# Patient Record
Sex: Female | Born: 1995
Health system: Southern US, Community
[De-identification: ages and names within clinical notes are randomized; demographics above are authoritative.]

## PROBLEM LIST (undated history)

## (undated) DIAGNOSIS — F419 Anxiety disorder, unspecified: Secondary | ICD-10-CM

## (undated) DIAGNOSIS — D649 Anemia, unspecified: Secondary | ICD-10-CM

## (undated) DIAGNOSIS — E119 Type 2 diabetes mellitus without complications: Secondary | ICD-10-CM

## (undated) DIAGNOSIS — F429 Obsessive-compulsive disorder, unspecified: Secondary | ICD-10-CM

## (undated) DIAGNOSIS — F329 Major depressive disorder, single episode, unspecified: Secondary | ICD-10-CM

## (undated) DIAGNOSIS — Z973 Presence of spectacles and contact lenses: Secondary | ICD-10-CM

## (undated) DIAGNOSIS — H40009 Preglaucoma, unspecified, unspecified eye: Secondary | ICD-10-CM

## (undated) DIAGNOSIS — E162 Hypoglycemia, unspecified: Secondary | ICD-10-CM

## (undated) DIAGNOSIS — N939 Abnormal uterine and vaginal bleeding, unspecified: Secondary | ICD-10-CM

## (undated) DIAGNOSIS — F32A Depression, unspecified: Secondary | ICD-10-CM

## (undated) HISTORY — DX: Obsessive-compulsive disorder, unspecified: F42.9

## (undated) HISTORY — DX: Preglaucoma, unspecified, unspecified eye: H40.009

## (undated) HISTORY — DX: Hypoglycemia, unspecified: E16.2

## (undated) HISTORY — DX: Anemia, unspecified: D64.9

## (undated) HISTORY — DX: Major depressive disorder, single episode, unspecified: F32.9

## (undated) HISTORY — DX: Depression, unspecified: F32.A

## (undated) HISTORY — PX: WISDOM TOOTH EXTRACTION: SHX21

## (undated) HISTORY — DX: Anxiety disorder, unspecified: F41.9

## (undated) HISTORY — PX: DENTAL SURGERY: SHX609

---
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
## 2014-02-11 ENCOUNTER — Ambulatory Visit: Admitting: Obstetrics & Gynecology

## 2014-02-13 ENCOUNTER — Ambulatory Visit: Admitting: Obstetrics & Gynecology

## 2014-03-04 ENCOUNTER — Ambulatory Visit: Admitting: Obstetrics & Gynecology

## 2014-03-13 ENCOUNTER — Encounter: Payer: Self-pay | Admitting: Obstetrics & Gynecology

## 2014-03-13 ENCOUNTER — Ambulatory Visit (INDEPENDENT_AMBULATORY_CARE_PROVIDER_SITE_OTHER): Admitting: Obstetrics & Gynecology

## 2014-03-13 VITALS — BP 108/66 | HR 97 | Temp 97.6°F | Ht 64.0 in | Wt 112.0 lb

## 2014-03-13 DIAGNOSIS — N926 Irregular menstruation, unspecified: Secondary | ICD-10-CM

## 2014-03-13 DIAGNOSIS — N939 Abnormal uterine and vaginal bleeding, unspecified: Secondary | ICD-10-CM

## 2014-03-13 MED ORDER — MEFENAMIC ACID 250 MG PO CAPS: 500.0000 mg | ORAL_CAPSULE | Freq: Three times a day (TID) | ORAL | Status: DC

## 2014-03-14 ENCOUNTER — Ambulatory Visit: Admitting: Obstetrics & Gynecology

## 2014-03-14 NOTE — Progress Notes (Signed)
Patient ID: Jillian [name], female   DOB: [birth year], 18 y.o.   MRN: [id number]  Chief Complaint  Patient presents with  . Follow-up    HPI Jillian [name] is a 18 y.o. female.  HPI  Past Medical History  Diagnosis Date  . Hypoglycemia   . Anemia   . Anxiety   . Depression   . OCD (obsessive compulsive disorder)     Past Surgical History  Procedure Laterality Date  . Dental surgery    . Wisdom tooth extraction Bilateral     Family History  Problem Relation Age of Onset  . Cancer      unknown  . Diabetes Mother   . Diabetes Paternal Grandmother   . Hypertension Paternal Grandfather   . Heart disease Maternal Grandfather   . Healthy Sister     x 3 half  . Glaucoma Father   . Eczema Sister     Social History History  Substance Use Topics  . Smoking status: Never Smoker   . Smokeless tobacco: Never Used  . Alcohol Use: No    No Known Allergies  Current Outpatient Prescriptions  Medication Sig Dispense Refill  . Mefenamic Acid (PONSTEL) 250 MG CAPS Take 2 capsules (500 mg total) by mouth 3 (three) times daily.  28 each  2  . senna (SENOKOT) 8.6 MG tablet Take 1 tablet by mouth daily.      . sertraline (ZOLOFT) 100 MG tablet Take 200 mg by mouth daily.       . traZODone (DESYREL) 50 MG tablet Take 150 mg by mouth at bedtime.        No current facility-administered medications for this visit.    Review of Systems Review of Systems Constitutional: negative for fatigue and weight loss Respiratory: negative for cough and wheezing Cardiovascular: negative for chest pain, fatigue and palpitations Gastrointestinal: negative for abdominal pain and change in bowel habits Genitourinary:negative for abnormal discharge Integument/breast: negative for nipple discharge Musculoskeletal:negative for myalgias Neurological: negative for gait problems and tremors Behavioral/Psych: negative for abusive relationship, depression Endocrine: negative for temperature intolerance      Blood pressure 108/66, pulse 97, temperature 97.6 F (36.4 C), height 5\' 4"  (1.626 m), weight 50.803 kg (112 lb), last menstrual period 03/04/2014.  Physical Exam Physical Exam   50% of 15 min visit spent on counseling and coordination of care.   Data Reviewed None  Assessment   Unscheduled bleeding with good response to Ponstel    Plan     Meds ordered this encounter  Medications  . DISCONTD: Mefenamic Acid (PONSTEL) 250 MG CAPS    Sig: Take 500 mg by mouth 3 (three) times daily.  . Mefenamic Acid (PONSTEL) 250 MG CAPS    Sig: Take 2 capsules (500 mg total) by mouth 3 (three) times daily.    Dispense:  28 each    Refill:  2    Follow up as needed.        JACKSON-MOORE,LISA A 03/14/2014, 1:07 PM

## 2014-07-18 ENCOUNTER — Other Ambulatory Visit: Payer: Self-pay | Admitting: Obstetrics & Gynecology

## 2014-07-18 NOTE — Telephone Encounter (Signed)
Please advise 

## 2014-07-21 NOTE — Telephone Encounter (Signed)
OK to refill

## 2014-07-31 ENCOUNTER — Inpatient Hospital Stay (HOSPITAL_COMMUNITY)
Admission: AD | Admit: 2014-07-31 | Discharge: 2014-08-05 | DRG: 885 | Disposition: A | Discharge status: Home / Self Care | Payer: Federal, State, Local not specified - Other | Attending: Psychiatry | Admitting: Psychiatry

## 2014-07-31 ENCOUNTER — Encounter (HOSPITAL_COMMUNITY): Payer: Self-pay | Admitting: General Practice

## 2014-07-31 DIAGNOSIS — F332 Major depressive disorder, recurrent severe without psychotic features: Secondary | ICD-10-CM | POA: Diagnosis present

## 2014-07-31 DIAGNOSIS — F42 Obsessive-compulsive disorder: Secondary | ICD-10-CM | POA: Diagnosis present

## 2014-07-31 DIAGNOSIS — Z8249 Family history of ischemic heart disease and other diseases of the circulatory system: Secondary | ICD-10-CM

## 2014-07-31 DIAGNOSIS — F411 Generalized anxiety disorder: Secondary | ICD-10-CM | POA: Diagnosis present

## 2014-07-31 DIAGNOSIS — R45851 Suicidal ideations: Secondary | ICD-10-CM | POA: Diagnosis present

## 2014-07-31 DIAGNOSIS — F331 Major depressive disorder, recurrent, moderate: Secondary | ICD-10-CM | POA: Insufficient documentation

## 2014-07-31 DIAGNOSIS — F410 Panic disorder [episodic paroxysmal anxiety] without agoraphobia: Secondary | ICD-10-CM | POA: Diagnosis present

## 2014-07-31 DIAGNOSIS — F329 Major depressive disorder, single episode, unspecified: Secondary | ICD-10-CM | POA: Diagnosis present

## 2014-07-31 DIAGNOSIS — Z833 Family history of diabetes mellitus: Secondary | ICD-10-CM | POA: Diagnosis not present

## 2014-07-31 DIAGNOSIS — G4700 Insomnia, unspecified: Secondary | ICD-10-CM | POA: Diagnosis present

## 2014-07-31 DIAGNOSIS — Z91013 Allergy to seafood: Secondary | ICD-10-CM | POA: Diagnosis not present

## 2014-07-31 LAB — GLUCOSE, CAPILLARY: Glucose-Capillary: 77 mg/dL (ref 70–99)

## 2014-07-31 LAB — URINALYSIS, ROUTINE W REFLEX MICROSCOPIC
Bilirubin Urine: NEGATIVE
Glucose, UA: NEGATIVE mg/dL
Ketones, ur: NEGATIVE mg/dL
Leukocytes, UA: NEGATIVE
Nitrite: NEGATIVE
PROTEIN: NEGATIVE mg/dL
SPECIFIC GRAVITY, URINE: 1.027 (ref 1.005–1.030)
UROBILINOGEN UA: 0.2 mg/dL (ref 0.0–1.0)
pH: 6.5 (ref 5.0–8.0)

## 2014-07-31 LAB — COMPREHENSIVE METABOLIC PANEL
ALK PHOS: 96 U/L (ref 39–117)
ALT: 12 U/L (ref 0–35)
AST: 17 U/L (ref 0–37)
Albumin: 4.1 g/dL (ref 3.5–5.2)
Anion gap: 11 (ref 5–15)
BILIRUBIN TOTAL: 0.2 mg/dL — AB (ref 0.3–1.2)
BUN: 13 mg/dL (ref 6–23)
CALCIUM: 9.8 mg/dL (ref 8.4–10.5)
CHLORIDE: 101 meq/L (ref 96–112)
CO2: 29 meq/L (ref 19–32)
Creatinine, Ser: 0.83 mg/dL (ref 0.50–1.10)
GFR calc Af Amer: >90 mL/min (ref >90)
GFR calc non Af Amer: >90 mL/min (ref >90)
Glucose, Bld: 85 mg/dL (ref 70–99)
Potassium: 3.5 mEq/L — ABNORMAL LOW (ref 3.7–5.3)
SODIUM: 141 meq/L (ref 137–147)
Total Protein: 8 g/dL (ref 6.0–8.3)

## 2014-07-31 LAB — CBC
HCT: 37.1 % (ref 36.0–46.0)
Hemoglobin: 12 g/dL (ref 12.0–15.0)
MCH: 28 pg (ref 26.0–34.0)
MCHC: 32.3 g/dL (ref 30.0–36.0)
MCV: 86.7 fL (ref 78.0–100.0)
PLATELETS: 331 10*3/uL (ref 150–400)
RBC: 4.28 MIL/uL (ref 3.87–5.11)
RDW: 12.8 % (ref 11.5–15.5)
WBC: 10.6 10*3/uL — AB (ref 4.0–10.5)

## 2014-07-31 LAB — RAPID URINE DRUG SCREEN, HOSP PERFORMED
Amphetamines: NOT DETECTED
Barbiturates: NOT DETECTED
Benzodiazepines: NOT DETECTED
Cocaine: NOT DETECTED
Opiates: NOT DETECTED
TETRAHYDROCANNABINOL: NOT DETECTED

## 2014-07-31 LAB — URINE MICROSCOPIC-ADD ON

## 2014-07-31 LAB — PREGNANCY, URINE: PREG TEST UR: NEGATIVE

## 2014-07-31 LAB — ETHANOL: Alcohol, Ethyl (B): <11 mg/dL (ref 0–11)

## 2014-07-31 MED ORDER — TRAZODONE HCL 50 MG PO TABS: 50.0000 mg | ORAL_TABLET | Freq: Every evening | ORAL | Status: DC | PRN

## 2014-07-31 MED ORDER — ALUM & MAG HYDROXIDE-SIMETH 200-200-20 MG/5ML PO SUSP
30.0000 mL | ORAL | Status: DC | PRN
  Filled 2014-07-31: qty 30

## 2014-07-31 MED ORDER — MAGNESIUM HYDROXIDE 400 MG/5ML PO SUSP
30.0000 mL | Freq: Every day | ORAL | Status: DC | PRN
  Filled 2014-07-31: qty 30

## 2014-07-31 MED ORDER — ACETAMINOPHEN 325 MG PO TABS
650.0000 mg | ORAL_TABLET | Freq: Four times a day (QID) | ORAL | Status: DC | PRN
  Filled 2014-07-31: qty 2

## 2014-07-31 MED ORDER — HYDROXYZINE HCL 25 MG PO TABS
25.0000 mg | ORAL_TABLET | Freq: Four times a day (QID) | ORAL | Status: DC | PRN
  Administered 2014-07-31: 25 mg via ORAL
  Filled 2014-07-31: qty 30
  Filled 2014-07-31 (×2): qty 1

## 2014-07-31 MED ORDER — TRAZODONE HCL 100 MG PO TABS
100.0000 mg | ORAL_TABLET | Freq: Every evening | ORAL | Status: DC | PRN
  Administered 2014-07-31 – 2014-08-04 (×5): 100 mg via ORAL
  Filled 2014-07-31 (×2): qty 1
  Filled 2014-07-31: qty 14
  Filled 2014-07-31 (×3): qty 1

## 2014-07-31 NOTE — Tx Team (Signed)
Initial Interdisciplinary Treatment Plan   PATIENT STRESSORS: Pt. reports medication is no longer working for her   PROBLEM LIST: Problem List/Patient Goals Date to be addressed Date deferred Reason deferred Estimated date of resolution  Risk for suicide  07/31/2014           Depression 07/31/2014           Anxiety 07/31/2014                              DISCHARGE CRITERIA:  Improved stabilization in mood, thinking, and/or behavior Verbal commitment to aftercare and medication compliance  PRELIMINARY DISCHARGE PLAN: Attend PHP/IOP Outpatient therapy  PATIENT/FAMIILY INVOLVEMENT: This treatment plan has been presented to and reviewed with the patient, Jillian [name].  The patient and family have been given the opportunity to ask questions and make suggestions.  Jillian [name] 07/31/2014, 6:34 PM

## 2014-07-31 NOTE — Plan of Care (Signed)
Problem: Ineffective individual coping Goal: STG: Patient will remain free from self harm Outcome: Progressing Pt safe on the unit Aeb visual and documented assessment  Problem: Diagnosis: Increased Risk For Suicide Attempt Goal: STG-Patient Will Comply With Medication Regime Outcome: Progressing Pt takes medication

## 2014-07-31 NOTE — Progress Notes (Signed)
D: Passive SI-contracts for safety Pt denies SHI/AVH. Pt is pleasant and cooperative. Pt stated she was glad she came to get help. Pt a little cautious, but does talk to Probation officer.  A: Pt was offered support and encouragement. Pt was given scheduled medications. Pt was encourage to attend groups. Q 15 minute checks were done for safety.   R:Pt attends groups and interacts well with peers and staff. Pt is taking medication. Pt has no complaints at this time.Pt receptive to treatment and safety maintained on unit.

## 2014-07-31 NOTE — Progress Notes (Signed)
Adult Psychoeducational Group Note  Date:  07/31/2014 Time:  8:42 PM  Group Topic/Focus:  Wrap-Up Group:   The focus of this group is to help patients review their daily goal of treatment and discuss progress on daily workbooks.  Participation Level:  Active  Participation Quality:  Appropriate  Affect:  Appropriate  Cognitive:  Appropriate  Insight: Appropriate  Engagement in Group:  Engaged  Modes of Intervention:  Discussion  Additional Comments: The patient had just arrived and did not attend group. Nash Shearer 07/31/2014, 8:42 PM

## 2014-07-31 NOTE — Progress Notes (Signed)
Patient ID: Jillian [name], female   DOB: Aug 11, 1996, 18 y.o.   MRN: [id number]  Jillian [name] is a 18 year old patient that walked in to [facility] for suicidal ideation. Patient reports that she has been on Zoloft for at least four years and reports it is no longer working for her. 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 Patient has a past medical history of hypoglycemia, anemia,  Depression, anxiety, and OCD. Patient reports that at times she has intrusive thoughts about hurting others but reports it is not directed towards a certain person. Patient denies A/V hallucinations. Patient reports she can contract for safety while in the hospital. Patient verbalized understanding of the admission process. Patient was oriented to the unit and was given hygiene products and went to dinner. Patient appears anxious but was encouraged by staff to come to them with any needs. Q15 minute safety checks were initiated and maintained at this time.

## 2014-07-31 NOTE — BH Assessment (Addendum)
Assessment Note  Jillian [name] is an 18 y.o. female. Pt presents to [facility] accompanied by her mother. Pt presents with C/O increased depression, anxiety and SI with a plan to overdose on her medication. Pt reports feeling overwhelmed about her academics and reports that she is not doing well. Pt reports that she consulted with her professor today about her concerns regarding dropping a class. Pt informed her professor today that she has been having some rough weekends as evidenced by decreased sleep and poor appetite. Pt reports that she has been compliant with her medication but mother reports that patient has had periods over the past month where she has not taken her psychiatric medications consistently. Pt reports that she has had previous issues with weight loss and not eating and implies that she may have an eating disorder(no binging or purging reporting) but has  never received treatment or been officially diagnosed with one. Pt and mother reports that pt's psychiatrist Marcy Salvo at [facility] are aware and have addressed her potential issues regarding possible eating disorder. Pt reports that she has been on a downward spiral for the past month and her symptoms are getting worse. Pt reports low energy and feeling fatigue.  Pt's mother reports that she is concerned about patient's safety due to remarks of wanting to commit suicide. Pt' s mother reports that she just wants patient to develop and healthy sense of self and feel like she deserves to live. Pt is unable to contract for safety and inpatient treatment is recommended for safety and stabilization.  Consulted with AC Debarah Crape and Elmarie Shiley, NP whom is recommending inpatient psychiatric treatment for safety and stabilization as patient meets inpatient treatment criteria. Pt accepted to bed 502-1 bed 1. Support paperwork completed by TTS.  Axis I: Major Depression, single episode Axis II: Deferred Axis III:  Past Medical History   Diagnosis Date  . Hypoglycemia   . Anemia   . Anxiety   . Depression   . OCD (obsessive compulsive disorder)    Axis IV: educational problems, other psychosocial or environmental problems and problems related to social environment Axis V: 21-30 behavior considerably influenced by delusions or hallucinations OR serious impairment in judgment, communication OR inability to function in almost all areas  Past Medical History:  Past Medical History  Diagnosis Date  . Hypoglycemia   . Anemia   . Anxiety   . Depression   . OCD (obsessive compulsive disorder)     Past Surgical History  Procedure Laterality Date  . Dental surgery    . Wisdom tooth extraction Bilateral     Family History:  Family History  Problem Relation Age of Onset  . Cancer      unknown  . Diabetes Mother   . Diabetes Paternal Grandmother   . Hypertension Paternal Grandfather   . Heart disease Maternal Grandfather   . Healthy Sister     x 3 half  . Glaucoma Father   . Eczema Sister     Social History:  reports that she has never smoked. She has never used smokeless tobacco. She reports that she does not drink alcohol or use illicit drugs.  Additional Social History:  Alcohol / Drug Use History of alcohol / drug use?: No history of alcohol / drug abuse  CIWA: CIWA-Ar BP: 111/65 mmHg Pulse Rate: 94 COWS:    Allergies: No Known Allergies  Home Medications:  Medications Prior to Admission  Medication Sig Dispense Refill  . Mefenamic  Acid 250 MG CAPS TAKE 2 CAPSULES (500 MG TOTAL) THREE TIMES A DAY 28 capsule 1  . senna (SENOKOT) 8.6 MG tablet Take 1 tablet by mouth daily.    . sertraline (ZOLOFT) 100 MG tablet Take 200 mg by mouth daily.     . traZODone (DESYREL) 50 MG tablet Take 150 mg by mouth at bedtime.       OB/GYN Status:  No LMP recorded.  General Assessment Data Location of Assessment: [facility] Is this a Tele or Face-to-Face Assessment?: Face-to-Face Is this an  Initial Assessment or a Re-assessment for this encounter?: Initial Assessment Living Arrangements: Non-relatives/Friends (roommate) Can pt return to current living arrangement?: Yes Admission Status: Voluntary Is patient capable of signing voluntary admission?: Yes Transfer from: Other (Comment) Referral Source: Self/Family/Friend     [place] Living Arrangements: Non-relatives/Friends (roommate) Name of Psychiatrist: Thompson Caul Name of Therapist: Ky Barban   Education Status Is patient currently in school?: Yes Current Grade: Student at [employer] Highest grade of school patient has completed: HS and some college Name of school: [employer] person: NA  Risk to self with the past 6 months Suicidal Ideation: Yes-Currently Present Suicidal Intent: Yes-Currently Present Is patient at risk for suicide?: Yes Suicidal Plan?: Yes-Currently Present Specify Current Suicidal Plan: plan to overdose on rx medication Access to Means: Yes Specify Access to Suicidal Means: access to rx meds What has been your use of drugs/alcohol within the last 12 months?: none reported Previous Attempts/Gestures: No (pt reports prior hx of SI) How many times?: 0 Other Self Harm Risks: none reported Triggers for Past Attempts: Unpredictable Intentional Self Injurious Behavior: None Family Suicide History: Yes (gm was dx Bipolar and Schizophrenic,uncle-suicide) Recent stressful life event(s): Conflict (Comment), Other (Comment) (academic related stress) Persecutory voices/beliefs?: No Depression: Yes Depression Symptoms: Despondent, Insomnia, Isolating, Fatigue, Guilt, Loss of interest in usual pleasures, Feeling worthless/self pity Substance abuse history and/or treatment for substance abuse?: No Suicide prevention information given to non-admitted patients: Not applicable  Risk to Others within the past 6 months Homicidal Ideation: No Thoughts of Harm to Others:  No Current Homicidal Intent: No Current Homicidal Plan: No Access to Homicidal Means: No Identified Victim: na History of harm to others?: No Assessment of Violence: None Noted Violent Behavior Description: None Noted Does patient have access to weapons?: No Criminal Charges Pending?: No Does patient have a court date: No  Psychosis Hallucinations: None noted Delusions: None noted  Mental Status Report Appear/Hygiene: Unremarkable Eye Contact: Good Motor Activity: Freedom of movement Speech: Soft Level of Consciousness: Alert Mood: Anxious Affect: Anxious Anxiety Level: Minimal Thought Processes: Coherent, Relevant Judgement: Unimpaired Orientation: Person, Place, Time, Situation Obsessive Compulsive Thoughts/Behaviors: None  Cognitive Functioning Concentration: Normal Memory: Recent Intact, Remote Intact IQ: Average Insight: Fair Impulse Control: Fair Appetite: Poor Weight Loss:  (ukn) Weight Gain: 0 Sleep: Decreased Total Hours of Sleep: 6 Vegetative Symptoms: None  ADLScreening [facility]) Patient's cognitive ability adequate to safely complete daily activities?: Yes Patient able to express need for assistance with ADLs?: Yes Independently performs ADLs?: Yes (appropriate for developmental age)  Prior Inpatient Therapy Prior Inpatient Therapy: No Prior Therapy Dates: na Prior Therapy Facilty/Provider(s): na Reason for Treatment: na  Prior Outpatient Therapy Prior Outpatient Therapy: Yes Prior Therapy Dates: Current Provider Prior Therapy Facilty/Provider(s): [facility] Reason for Treatment: Psychiatry and OPT  ADL Screening (condition at time of admission) Patient's cognitive ability adequate to safely complete daily activities?: Yes Is the  patient deaf or have difficulty hearing?: No Does the patient have difficulty seeing, even when wearing glasses/contacts?: No Does the patient have difficulty concentrating, remembering,  or making decisions?: No Patient able to express need for assistance with ADLs?: Yes Does the patient have difficulty dressing or bathing?: No Independently performs ADLs?: Yes (appropriate for developmental age) Does the patient have difficulty walking or climbing stairs?: No Weakness of Legs: None Weakness of Arms/Hands: None  Home Assistive Devices/Equipment Home Assistive Devices/Equipment: None  Therapy Consults (therapy consults require a physician order) PT Evaluation Needed: No OT Evalulation Needed: No SLP Evaluation Needed: No Abuse/Neglect Assessment (Assessment to be complete while patient is alone) Physical Abuse: Denies Verbal Abuse: Denies Sexual Abuse: Denies Exploitation of patient/patient's resources: Denies Self-Neglect: Denies Values / Beliefs Cultural Requests During Hospitalization: None Spiritual Requests During Hospitalization: None Consults Spiritual Care Consult Needed: No Social Work Consult Needed: No Regulatory affairs officer (For Healthcare) Does patient have an advance directive?: No Would patient like information on creating an advanced directive?: No - patient declined information Nutrition Screen- [facility] Adult/WL/AP Patient's home diet: Regular  Additional Information 1:1 In Past 12 Months?: No CIRT Risk: No Elopement Risk: No Does patient have medical clearance?: No     Disposition:  Disposition Initial Assessment Completed for this Encounter: Yes Disposition of Patient: Inpatient treatment program (Pt accepted to [facility] per Elvera Bicker to 502-1) Type of inpatient treatment program: Adult  On Site Evaluation by:   Reviewed with Physician:    Wellington Hampshire, MS, LCASA Assessment Counselor  07/31/2014 7:59 PM

## 2014-08-01 DIAGNOSIS — F411 Generalized anxiety disorder: Secondary | ICD-10-CM

## 2014-08-01 DIAGNOSIS — F332 Major depressive disorder, recurrent severe without psychotic features: Secondary | ICD-10-CM | POA: Insufficient documentation

## 2014-08-01 DIAGNOSIS — R45851 Suicidal ideations: Secondary | ICD-10-CM

## 2014-08-01 LAB — GLUCOSE, CAPILLARY
GLUCOSE-CAPILLARY: 73 mg/dL (ref 70–99)
GLUCOSE-CAPILLARY: 78 mg/dL (ref 70–99)

## 2014-08-01 LAB — TSH: TSH: 0.914 u[IU]/mL (ref 0.350–4.500)

## 2014-08-01 MED ORDER — POTASSIUM CHLORIDE CRYS ER 20 MEQ PO TBCR
30.0000 meq | EXTENDED_RELEASE_TABLET | Freq: Once | ORAL | Status: AC
  Administered 2014-08-01: 30 meq via ORAL
  Filled 2014-08-01 (×3): qty 1

## 2014-08-01 MED ORDER — SERTRALINE HCL 100 MG PO TABS
100.0000 mg | ORAL_TABLET | Freq: Two times a day (BID) | ORAL | Status: DC
  Administered 2014-08-01 – 2014-08-05 (×8): 100 mg via ORAL
  Filled 2014-08-01: qty 1
  Filled 2014-08-01: qty 2
  Filled 2014-08-01 (×6): qty 1
  Filled 2014-08-01: qty 28
  Filled 2014-08-01 (×2): qty 1
  Filled 2014-08-01: qty 28
  Filled 2014-08-01: qty 2
  Filled 2014-08-01: qty 1

## 2014-08-01 MED ORDER — ENSURE COMPLETE PO LIQD
237.0000 mL | Freq: Two times a day (BID) | ORAL | Status: DC
  Administered 2014-08-01 – 2014-08-05 (×8): 237 mL via ORAL

## 2014-08-01 MED ORDER — ARIPIPRAZOLE 2 MG PO TABS
2.0000 mg | ORAL_TABLET | Freq: Every day | ORAL | Status: DC
  Administered 2014-08-01 – 2014-08-05 (×5): 2 mg via ORAL
  Filled 2014-08-01 (×3): qty 1
  Filled 2014-08-01: qty 14
  Filled 2014-08-01 (×5): qty 1

## 2014-08-01 NOTE — H&P (Signed)
Psychiatric Admission Assessment Adult  Patient Identification:  Jillian [name] Date of Evaluation:  08/01/2014 Chief Complaint:  MDD History of Present Illness:  Jillian [name] is an 18 y.o. 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  She presents to [facility]  accompanied by her mother. Pt presents with C/O increased depression, anxiety and SI with a plan to overdose on her medication.  "I usually have suicidal thoughts but I give myself 24 hrs to see if I can overcome the feelings.  I always have gotten over it."  Pt reports feeling overwhelmed about her academics and reports that she is not doing well. Pt reports that she consulted with her professor today about her concerns regarding dropping a class, having some insomnia and poor appetite. Pt reports that she has been compliant with her medication but mother reports that patient has had periods over the past month where she has not taken her psychiatric medications consistently. Pt reports that she has had previous issues with weight loss and not eating and implies that she may have an eating disorder(no binging or purging reporting) but has never received treatment or been officially diagnosed with one. Pt and mother reports that pt's psychiatrist Marcy Salvo at [facility] are aware and have addressed her potential issues regarding possible eating disorder. Pt reports that she has been on a downward spiral for the past month and her symptoms are getting worse. Pt reports low energy and feeling fatigue. Pt's mother reports that she is concerned about patient's safety due to remarks of wanting to commit suicide. Pt' s mother reports that she just wants patient to develop and healthy sense of self and feel like she deserves to live. Pt is unable to contract for safety and inpatient treatment is recommended for safety and stabilization.  It is recommended that [place] would benefit from inpatient psychiatric treatment for safety and  stabilization as patient meets inpatient treatment criteria.  Medication management as part of care plan.    Elements:  Location:  Anxiety disorder. Quality:  Anxiety, fear of large crowds, feelings of isolating. Severity:  Severe, came in to [facility], stated she had SI via OD. Timing:  Increased stress at school.  She is a Electronics engineer. Duration:  Chronic, Diagnosed with anxiety disorder at 18 year old. Context:  "I go through this every other month.  I feel anxious and depressed about school and in social settings?. Associated Signs/Synptoms: Depression Symptoms:  depressed mood, anhedonia, insomnia, fatigue, feelings of worthlessness/guilt, difficulty concentrating, hopelessness, recurrent thoughts of death, suicidal attempt, anxiety, panic attacks, (Hypo) Manic Symptoms:  NA Anxiety Symptoms:  NA Psychotic Symptoms:  NA PTSD Symptoms: NA Total Time spent with patient: 45 minutes  Psychiatric Specialty Exam: Physical Exam  Vitals reviewed. Psychiatric: Her speech is normal and behavior is normal. Judgment and thought content normal. Her mood appears anxious. Cognition and memory are normal.    Review of Systems  Constitutional: Negative.   HENT: Negative.   Eyes: Negative.   Respiratory: Negative.   Cardiovascular: Negative.   Gastrointestinal: Negative.   Genitourinary: Negative.   Musculoskeletal: Negative.   Skin: Negative.   Neurological: Negative.   Endo/Heme/Allergies: Negative.   Psychiatric/Behavioral: Positive for depression. Negative for suicidal ideas, hallucinations, memory loss and substance abuse. The patient is nervous/anxious. The patient does not have insomnia.     Blood pressure 101/51, pulse 98, temperature 98.5 F (36.9 C), temperature source Oral, resp. rate 18, height 5' 3.25" (1.607 m), weight  52.164 kg (115 lb).Body mass index is 20.2 kg/(m^2).  General Appearance: Fairly Groomed  Engineer, water::  Fair  Speech:  Normal Rate  Volume:  Normal   Mood:  She is calm and intelligent  Affect:  Appropriate  Thought Process:  Intact, Linear and Logical  Orientation:  Full (Time, Place, and Person)  Thought Content:  Rumination  Suicidal Thoughts:  Yes.  with intent/plan  Homicidal Thoughts:  No  Memory:  Immediate;   Good Recent;   Good Remote;   Good  Judgement:  Good  Insight:  Good  Psychomotor Activity:  Negative and Normal  Concentration:  Good  Recall:  Good  Fund of Knowledge:Good  Language: Good  Akathisia:  Negative  Handed:  Right  AIMS (if indicated):     Assets:  Communication Skills Desire for Improvement Physical Health Resilience Social Support Vocational/Educational  Sleep:  Number of Hours: 6.25    Musculoskeletal: Strength & Muscle Tone: within normal limits Gait & Station: normal Patient leans: Right  Past Psychiatric History: Diagnosis:  Hospitalizations:  Outpatient Care:  Therapist Ky Barban and Psychiatrist Marcy Salvo MD)-[facility]  Substance Abuse Care:  NA  Self-Mutilation:  Did not endorse  Suicidal Attempts:   OD on meds  Violent Behaviors:  Did not endorse   Past Medical History:   Past Medical History  Diagnosis Date  . Hypoglycemia   . Anemia   . Anxiety   . Depression   . OCD (obsessive compulsive disorder)    None. Allergies:   Allergies  Allergen Reactions  . Chicken Allergy Nausea And Vomiting  . Shrimp [Shellfish Allergy] Nausea And Vomiting   PTA Medications: Prescriptions prior to admission  Medication Sig Dispense Refill Last Dose  . etonogestrel (NEXPLANON) 68 MG IMPL implant 1 each by Subdermal route once.   08/01/2014  . Mefenamic Acid 250 MG CAPS Take 2 capsules by mouth 3 (three) times daily as needed (For menstrual pain.).   2 months ago  . Olopatadine HCl (PATADAY) 0.2 % SOLN Place 2 drops into both eyes daily as needed (For allergies.).   2 months ago  . senna (SENOKOT) 8.6 MG tablet Take 1 tablet by mouth daily as needed for  constipation.    January or February 2015  . sertraline (ZOLOFT) 100 MG tablet Take 200 mg by mouth every morning.    07/31/2014  . traZODone (DESYREL) 150 MG tablet Take 150 mg by mouth at bedtime.   07/30/2014  . Mefenamic Acid 250 MG CAPS TAKE 2 CAPSULES (500 MG TOTAL) THREE TIMES A DAY 28 capsule 1   . traZODone (DESYREL) 50 MG tablet Take 150 mg by mouth at bedtime.    Taking    Previous Psychotropic Medications:  Medication/Dose  Zoloft since 18 year old  Trazodone 50 mg for sleep             Substance Abuse History in the last 12 months:  No.  Consequences of Substance Abuse: NA  Social History:  reports that she has never smoked. She has never used smokeless tobacco. She reports that she does not drink alcohol or use illicit drugs. Additional Social History: History of alcohol / drug use?: No history of alcohol / drug abuse    Current Place of Residence:  [place] of Birth:  [place] Family Members: Marital Status:  Single Children:  none  Sons:  Daughters: Relationships: Education:  Dentist Problems/Performance: Religious Beliefs/Practices: History of Abuse (Emotional/Physical/Sexual) Occupational Experiences;  none Nature conservation officer  History:  None. Legal History:  none Hobbies/Interests:  Transport planner  Family History:   Family History  Problem Relation Age of Onset  . Cancer      unknown  . Diabetes Mother   . Diabetes Paternal Grandmother   . Hypertension Paternal Grandfather   . Heart disease Maternal Grandfather   . Healthy Sister     x 3 half  . Glaucoma Father   . Eczema Sister     Results for orders placed or performed during the hospital encounter of 07/31/14 (from the past 72 hour(s))  Glucose, capillary     Status: None   Collection Time: 07/31/14  5:36 PM  Result Value Ref Range   Glucose-Capillary 77 70 - 99 mg/dL   Comment 1 Notify RN   Urinalysis, Routine w reflex microscopic     Status: Abnormal    Collection Time: 07/31/14  6:18 PM  Result Value Ref Range   Color, Urine AMBER (A) YELLOW    Comment: BIOCHEMICALS MAY BE AFFECTED BY COLOR   APPearance HAZY (A) CLEAR   Specific Gravity, Urine 1.027 1.005 - 1.030   pH 6.5 5.0 - 8.0   Glucose, UA NEGATIVE NEGATIVE mg/dL   Hgb urine dipstick SMALL (A) NEGATIVE   Bilirubin Urine NEGATIVE NEGATIVE   Ketones, ur NEGATIVE NEGATIVE mg/dL   Protein, ur NEGATIVE NEGATIVE mg/dL   Urobilinogen, UA 0.2 0.0 - 1.0 mg/dL   Nitrite NEGATIVE NEGATIVE   Leukocytes, UA NEGATIVE NEGATIVE    Comment: Performed at [facility]  Pregnancy, urine     Status: None   Collection Time: 07/31/14  6:18 PM  Result Value Ref Range   Preg Test, Ur NEGATIVE NEGATIVE    Comment:        THE SENSITIVITY OF THIS METHODOLOGY IS >20 mIU/mL. Performed at [facility]   Urine rapid drug screen (hosp performed)     Status: None   Collection Time: 07/31/14  6:18 PM  Result Value Ref Range   Opiates NONE DETECTED NONE DETECTED   Cocaine NONE DETECTED NONE DETECTED   Benzodiazepines NONE DETECTED NONE DETECTED   Amphetamines NONE DETECTED NONE DETECTED   Tetrahydrocannabinol NONE DETECTED NONE DETECTED   Barbiturates NONE DETECTED NONE DETECTED    Comment:        DRUG SCREEN FOR MEDICAL PURPOSES ONLY.  IF CONFIRMATION IS NEEDED FOR ANY PURPOSE, NOTIFY LAB WITHIN 5 DAYS.        LOWEST DETECTABLE LIMITS FOR URINE DRUG SCREEN Drug Class       Cutoff (ng/mL) Amphetamine      1000 Barbiturate      200 Benzodiazepine   696 Tricyclics       295 Opiates          300 Cocaine          300 THC              50 Performed at [facility]   Urine microscopic-add on     Status: Abnormal   Collection Time: 07/31/14  6:18 PM  Result Value Ref Range   Squamous Epithelial / LPF RARE RARE   WBC, UA 3-6 <3 WBC/hpf   RBC / HPF 3-6 <3 RBC/hpf   Bacteria, UA MANY (A) RARE    Comment: Performed at [facility]  CBC     Status: Abnormal   Collection Time: 07/31/14  7:35 PM  Result Value Ref Range  WBC 10.6 (H) 4.0 - 10.5 K/uL   RBC 4.28 3.87 - 5.11 MIL/uL   Hemoglobin 12.0 12.0 - 15.0 g/dL   HCT 37.1 36.0 - 46.0 %   MCV 86.7 78.0 - 100.0 fL   MCH 28.0 26.0 - 34.0 pg   MCHC 32.3 30.0 - 36.0 g/dL   RDW 12.8 11.5 - 15.5 %   Platelets 331 150 - 400 K/uL    Comment: Performed at [facility]  Comprehensive metabolic panel     Status: Abnormal   Collection Time: 07/31/14  7:35 PM  Result Value Ref Range   Sodium 141 137 - 147 mEq/L   Potassium 3.5 (L) 3.7 - 5.3 mEq/L   Chloride 101 96 - 112 mEq/L   CO2 29 19 - 32 mEq/L   Glucose, Bld 85 70 - 99 mg/dL   BUN 13 6 - 23 mg/dL   Creatinine, Ser 0.83 0.50 - 1.10 mg/dL   Calcium 9.8 8.4 - 10.5 mg/dL   Total Protein 8.0 6.0 - 8.3 g/dL   Albumin 4.1 3.5 - 5.2 g/dL   AST 17 0 - 37 U/L   ALT 12 0 - 35 U/L   Alkaline Phosphatase 96 39 - 117 U/L   Total Bilirubin 0.2 (L) 0.3 - 1.2 mg/dL   GFR calc non Af Amer >90 >90 mL/min   GFR calc Af Amer >90 >90 mL/min    Comment: (NOTE) The eGFR has been calculated using the CKD EPI equation. This calculation has not been validated in all clinical situations. eGFR's persistently <90 mL/min signify possible Chronic Kidney Disease.    Anion gap 11 5 - 15    Comment: Performed at [facility]  Ethanol     Status: None   Collection Time: 07/31/14  7:35 PM  Result Value Ref Range   Alcohol, Ethyl (B) <11 0 - 11 mg/dL    Comment:        LOWEST DETECTABLE LIMIT FOR SERUM ALCOHOL IS 11 mg/dL FOR MEDICAL PURPOSES ONLY Performed at [facility]   TSH     Status: None   Collection Time: 07/31/14  7:35 PM  Result Value Ref Range   TSH 0.914 0.350 - 4.500 uIU/mL    Comment: Performed at [facility]  Glucose, capillary     Status: None   Collection Time: 08/01/14  6:40 AM  Result Value Ref Range   Glucose-Capillary 73 70 - 99 mg/dL    Psychological Evaluations:  Assessment:   DSM5:  Schizophrenia Disorders:  NA Obsessive-Compulsive Disorders:  NA Trauma-Stressor Disorders:  NA Substance/Addictive Disorders:  NA Depressive Disorders:  NA  AXIS I:  Generalized Anxiety Disorder AXIS II:  Deferred AXIS III:   Past Medical History  Diagnosis Date  . Hypoglycemia   . Anemia   . Anxiety   . Depression   . OCD (obsessive compulsive disorder)    AXIS IV:  other psychosocial or environmental problems AXIS V:  51-60 moderate symptoms  Treatment Plan/Recommendations:   Observation Level/Precautions:  15 minute checks  Laboratory:  Labs resulted, reviewed, and stable at this time.   Psychotherapy:  Group therapy, individual therapy, psychoeducation  Medications:  See MAR above  Consultations: None    Discharge Concerns: None    Estimated LOS: 5-7 days  Other:  N/A    Treatment Plan Summary: Daily contact with patient to assess and evaluate symptoms and progress in treatment Medication management  Continue Zoloft 100 mg BID  depression Start Abilify  83m QD mood stability Trazodone 100 mg PRN QHS insomnia Group milieu therapy  Current Medications:  Current Facility-Administered Medications  Medication Dose Route Frequency Provider Last Rate Last Dose  . acetaminophen (TYLENOL) tablet 650 mg  650 mg Oral Q6H PRN LElmarie Shiley NP      . alum & mag hydroxide-simeth (MAALOX/MYLANTA) 200-200-20 MG/5ML suspension 30 mL  30 mL Oral Q4H PRN LElmarie Shiley NP      . ARIPiprazole (ABILIFY) tablet 2 mg  2 mg Oral Daily FMyer PeerCobos, MD      . feeding supplement (ENSURE COMPLETE) (ENSURE COMPLETE) liquid 237 mL  237 mL Oral BID BM FMyer PeerCobos, MD   237 mL at 08/01/14 1257  . hydrOXYzine (ATARAX/VISTARIL) tablet 25 mg  25 mg Oral Q6H PRN LElmarie Shiley NP   25 mg at 07/31/14 2137  . magnesium hydroxide (MILK OF MAGNESIA) suspension 30 mL  30 mL Oral Daily PRN LElmarie Shiley NP      . sertraline (ZOLOFT) tablet 100  mg  100 mg Oral BID FJenne Campus MD      . traZODone (DESYREL) tablet 100 mg  100 mg Oral QHS PRN LElmarie Shiley NP   100 mg at 07/31/14 2137    Observation Level/Precautions:  15 minute checks  Laboratory:  Per ED  Psychotherapy:  Milieu  Medications:  As per med list  Consultations:  As needed  Discharge Concerns:  Safety  Estimated LOS:  2-4 days  Other:     I certify that inpatient services furnished can reasonably be expected to improve the patient's condition.   AKerrie BuffaloMAY, AGNP-BC 11/5/20154:46 PM   Patient case discussed with NP as above. Agree with NP's Assessment and Note. Have also met with patient. [age]year old female, college student, presenting with worsening depression. (+) neuro vegetative symptoms of depression and suicidal ideations, but currently able to contract for safety on the unit. Will manage with Zoloft and Abilify as augmentation.

## 2014-08-01 NOTE — Progress Notes (Signed)
Adult Psychoeducational Group Note  Date:  08/01/2014 Time:  8:39 PM  Group Topic/Focus:  Wrap-Up Group:   The focus of this group is to help patients review their daily goal of treatment and discuss progress on daily workbooks.  Participation Level:  Active  Participation Quality:  Appropriate  Affect:  Appropriate  Cognitive:  Appropriate  Insight: Appropriate  Engagement in Group:  Engaged  Modes of Intervention:  Discussion  Additional Comments:  The patient expressed that group was good.The patient did not talk much about group.  Nash Shearer 08/01/2014, 8:39 PM

## 2014-08-01 NOTE — BHH Group Notes (Signed)
[place] LCSW Group Therapy  08/01/2014 1:38 PM   Type of Therapy:  Group Therapy  Participation Level:  Active  Participation Quality:  Attentive  Affect:  Appropriate  Cognitive:  Appropriate  Insight:  Improving  Engagement in Therapy:  Engaged  Modes of Intervention:  Clarification, Education, Exploration and Socialization  Summary of Progress/Problems: Today's group focused on relapse prevention.  We defined the term, and then brainstormed on ways to prevent relapse.  "Relapse is when you go back to some behavior in the past."  Talked about despite having a psychiatrist and therapist, she still gets to a dark place sometimes, "and I just have a hard time finding my way out."  Identified mother and "Joey" as good supports.  Misses her cats when at school, and thinks they actually help her stay in recovery.  [name] 08/01/2014 , 1:38 PM

## 2014-08-01 NOTE — Progress Notes (Signed)
D Pt. Denies SI and HI, no complaints of pain or discomfort noted at this time.  A Writer offered support and encouragement, discussed increasing intake this pm with pt.,  To decrease  Possibility of am hypoglycemia.  R Pt. 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 Admits this has been a stressful year, her classes have been difficult for her this year. Pt. Rates her depression at a 2 and her anxiety at a 2 to 5.

## 2014-08-01 NOTE — Progress Notes (Signed)
NUTRITION ASSESSMENT  Pt identified as at risk on the Malnutrition Screen Tool  INTERVENTION: 1. Educated patient on the importance of nutrition and encouraged intake of food and beverages. 2. Discussed weight goals. 3. Supplements: Ensure Complete po BID, each supplement provides 350 kcal and 13 grams of protein  NUTRITION DIAGNOSIS: Unintentional weight loss related to sub-optimal intake as evidenced by pt report.   Goal: Pt to meet >/= 90% of their estimated nutrition needs.  Monitor:  PO intake  Assessment:  18 year old patient that walked in to [facility] for suicidal ideation.Patient has a past medical history of hypoglycemia, anemia, Depression, anxiety, and OCD.  Pt reports having a history of low blood sugars, states she knows what carbohydrate sources are. Pt reports eating red meats d/t chicken and shrimp allergy. 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  Pt reports she ate breakfast this AM. Pt has an adequate appetite currently but PTA, pt only ate 1 meal/day. States she is trying to eat more frequently.  Pt has had weight loss over the last year, about 9 lbs, (7% wt loss x 1 year, this is insignificant for the time frame).    Pt is receiving Ensure supplements BID.  Discussed with pt the importance of eating 3 meals a day with snacks, emphasizing protein consumption. Discussed the importance of good nutrition for mental health and aiding in depression and anxiety. Discussed good sources of iron with pt.  Height: Ht Readings from Last 1 Encounters:  07/31/14 5' 3.25" (1.607 m) (34 %*, Z = -0.41)   * Growth percentiles are based on CDC 2-20 Years data.    Weight: Wt Readings from Last 1 Encounters:  07/31/14 115 lb (52.164 kg) (25 %*, Z = -0.68)   * Growth percentiles are based on CDC 2-20 Years data.    Weight Hx: Wt Readings from Last 10 Encounters:  07/31/14 115 lb (52.164 kg) (25 %*, Z = -0.68)  03/13/14 112 lb  (50.803 kg) (20 %*, Z = -0.83)  08/13/13 124 lb (56.246 kg) (48 %*, Z = -0.05)  06/22/13 130 lb (58.968 kg) (60 %*, Z = 0.25)   * Growth percentiles are based on CDC 2-20 Years data.    BMI:  Body mass index is 20.2 kg/(m^2). Pt meets criteria for normal range based on current BMI.  Estimated Nutritional Needs: Kcal: 25-30 kcal/kg Protein: > 1 gram protein/kg Fluid: 1 ml/kcal  Diet Order: Diet regular Pt is also offered choice of unit snacks mid-morning and mid-afternoon.  Pt is eating as desired.   Lab results and medications reviewed: Desyrel  Clayton Bibles, MS, RD, LDN Pager: [phone] After Hours Pager: [phone]

## 2014-08-01 NOTE — Tx Team (Signed)
Interdisciplinary Treatment Plan Update (Adult)  Date: 08/01/2014 Time Reviewed: 11:02 AM  Progress in Treatment:  Attending groups: Yes Participating in groups:  Yes  Taking medication as prescribed: Yes  Tolerating medication: Yes  Family/Significant other contact made:Yes  Patient understands diagnosis: Yes, AEB seeking help for depression and SI.  Discussing patient identified problems/goals with staff: Yes  Medical problems stabilized or resolved: Yes  Denies suicidal/homicidal ideation: Yes  In tx team Patient has not harmed self or Others: Yes   New problem(s) identified: N/A  Discharge Plan or Barriers: Pt plans to return home and receive outpatient services.   Additional comments:Jillian [name] is an 18 y.o. female. Pt presents with C/O increased depression, anxiety and SI with a plan to overdose on her medication. Pt reports feeling overwhelmed about her academics and reports that she is not doing well. Pt reports that she consulted with her professor about her concerns regarding dropping a class. Pt informed her professor today that she has been having some rough weekends as evidenced by decreased sleep and poor appetite. Pt reports that she has been compliant with her medication but mother reports that patient has had periods over the past month where she has not taken her psychiatric medications consistently. Pt reports that she has had previous issues with weight loss and not eating and implies that she may have an eating disorder(no binging or purging reporting) but has never received treatment or been officially diagnosed with one.  Pt and CSW Intern reviewed pt's identified goals and treatment plan. Pt verbalized understanding and agreed to treatment plan  Reason for Continuation of Hospitalization:  Medication Stabilization Suicidal Ideation Depression   Estimated length of stay: 4-5 days   Attendees:  Patient:  08/01/2014 11:02 AM   Family:  11/5/201511:02 AM    Physician: Neita Garnet MD  11/5/201511:02 AM  Nursing: Pilar Plate, RN  08/01/2014 11:02 AM  Clinical Social Worker: Roque Lias, LCSW  11/5/201511:02 AM  Clinical Social Worker: Bonnye Fava, [place] Intern 11/5/201511:02 AM  Other:  11/5/201511:02 AM  Other:  11/5/201511:02 AM  Other:  11/5/201511:02 AM  Scribe for Treatment Team:  Bonnye Fava, [place] Intern 08/01/2014 11:02 AM

## 2014-08-01 NOTE — BHH Suicide Risk Assessment (Signed)
[place] INPATIENT:  Family/Significant Other Suicide Prevention Education  Suicide Prevention Education:  Education Completed;Jillian [name], mother [phone] has been identified by the patient as the family member/significant other with whom the patient will be residing, and identified as the person(s) who will aid the patient in the event of a mental health crisis (suicidal ideations/suicide attempt).  With written consent from the patient, the family member/significant other has been provided the following suicide prevention education, prior to the and/or following the discharge of the patient.  The suicide prevention education provided includes the following:  Suicide risk factors  Suicide prevention and interventions  National Suicide Hotline telephone number  [facility] assessment telephone number  [facility] Emergency Assistance [place] and/or Residential Mobile Crisis Unit telephone number  Request made of family/significant other to:  Remove weapons (e.g., guns, rifles, knives), all items previously/currently identified as safety concern.    Remove drugs/medications (over-the-counter, prescriptions, illicit drugs), all items previously/currently identified as a safety concern.  The family member/significant other verbalizes understanding of the suicide prevention education information provided.  The family member/significant other agrees to remove the items of safety concern listed above.  Jillian [name] 08/01/2014, 5:19 PM

## 2014-08-01 NOTE — [facility] Suicide Risk Assessment (Addendum)
   Nursing information obtained from:  Patient Demographic factors:  Adolescent or young adult Current Mental Status:  Self-harm thoughts Loss Factors:  NA Historical Factors:  Family history of mental illness or substance abuse Risk Reduction Factors:  Living with another person, especially a relative Total Time spent with patient: 45 minutes  CLINICAL FACTORS:   Depression, difficulty functioning in daily activities   Psychiatric Specialty Exam: Physical Exam  ROS  Blood pressure 101/51, pulse 98, temperature 98.5 F (36.9 C), temperature source Oral, resp. rate 18, height 5' 3.25" (1.607 m), weight 52.164 kg (115 lb).Body mass index is 20.2 kg/(m^2).  General Appearance: Fairly Groomed  Engineer, water::  Good  Speech:  Normal Rate  Volume:  Decreased  Mood:  Depressed  Affect:  Constricted  Thought Process:  Goal Directed and Linear  Orientation:  Full (Time, Place, and Person)  Thought Content:  states she sometimes hears " my name being called", but at this time does not present internally preoccupied. No deluisions expressed. Ruminative about her depression and makes frequent self deprecating comments during session, such as that she feels she is not " doing enough", that she is not " good enough" and that she is letting her mother down  Suicidal Thoughts:  Yes.  without intent/plan- has had passive thoughts of death/dying but today denies any plan or intention of hurting self and contracts for safety on the unit  Homicidal Thoughts:  No  Memory:  recent and remote grossly intact  Judgement:  Fair  Insight:  Fair  Psychomotor Activity:  Decreased  Concentration:  Good  Recall:  Good  Fund of Knowledge:Good  Language: Good  Akathisia:  Yes  Handed:  Right  AIMS (if indicated):     Assets:  Communication Skills Desire for Improvement Physical Health Resilience  Sleep:  Number of Hours: 6.25   Musculoskeletal: Strength & Muscle Tone: within normal limits Gait & Station:  normal Patient leans: N/A  COGNITIVE FEATURES THAT CONTRIBUTE TO RISK:  Closed-mindedness    SUICIDE RISK:   Moderate:  Frequent suicidal ideation with limited intensity, and duration, some specificity in terms of plans, no associated intent, good self-control, limited dysphoria/symptomatology, some risk factors present, and identifiable protective factors, including available and accessible social support.  PLAN OF CARE: Patient will be admitted to inpatient psychiatric unit for stabilization and safety. Will provide and encourage milieu participation. Provide medication management and maked adjustments as needed.  Will follow daily.    I certify that inpatient services furnished can reasonably be expected to improve the patient's condition.  Tamea Bai, [place]ne 08/01/2014, 4:08 PM

## 2014-08-01 NOTE — Progress Notes (Signed)
D: Pt presents with flat affect and depressed mood. Pt has minimal interaction on the unit and feels anxious around large groups of people. Pt denies feeling suicidal today but reports feeling increasingly suicidal over the last month. Pt verbalized self esteem issues and reported intermittent auditory hallucinations. Pt reported poor eating and low energy level. A: Medications administered as ordered per MD. Verbal support given. Pt encourage to eat meals and given snacks throughout the day. Pt encouraged to attend groups. 15 minute checks performed for safety.  R: Pt safety maintained at this time.
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
## 2014-08-01 NOTE — [facility] Group Notes (Signed)
[facility] Group Notes:  (Nursing/MHT/Case Management/Adjunct)  Date:  08/01/2014  Time:  9:56 AM  Type of Therapy:  Nurse Education  Participation Level:  Active  Participation Quality:  Appropriate and Attentive  Affect:  Appropriate  Cognitive:  Alert  Insight:  Good  Engagement in Group:  Engaged  Modes of Intervention:  Discussion and Education  Summary of Progress/Problems: The focus of this group is leisure and lifestyle changes. The group explores ways to change current lifestyle and think of leisure activities. Patient was attentive in group. Patient participated as appropriate.  Gaylan Gerold E 08/01/2014, 9:56 AM

## 2014-08-02 DIAGNOSIS — F909 Attention-deficit hyperactivity disorder, unspecified type: Secondary | ICD-10-CM

## 2014-08-02 LAB — GLUCOSE, CAPILLARY
Glucose-Capillary: 86 mg/dL (ref 70–99)
Glucose-Capillary: 97 mg/dL (ref 70–99)
Glucose-Capillary: 88 mg/dL (ref 70–99)

## 2014-08-02 NOTE — B[facility] Group Notes (Signed)
[facility]  [facility] LCSW Aftercare Discharge Planning Group Note   08/02/2014 10:06 AM  Participation Quality:  Active  Mood/Affect:  Depressed  Depression Rating:  1  Anxiety Rating:  3  Thoughts of Suicide:  No Will you contract for safety?   NA  Current AVH:  No  Plan for Discharge/Comments: Pt plans to return home and receive outpatient services. Taiylor reports she is feeling better. Her mother is coming to visit her tonight and she is excited to see her. She reported she ate breakfast this morning.   Transportation Means: Mother  Supports: Mother  [name],[name]

## 2014-08-02 NOTE — [facility] Group Notes (Signed)
Jillian [name] [place] LCSW Group Therapy  08/02/2014  1:05 PM  Type of Therapy:  Group therapy  Participation Level:  Active  Participation Quality:  Attentive  Affect:  Flat  Cognitive:  Oriented  Insight:  Limited  Engagement in Therapy:  Limited  Modes of Intervention:  Discussion, Socialization  Summary of Progress/Problems:  Chaplain was here to lead a group on themes of hope and courage.  [name] sat quietly through group.  She did not offer anything spontaneously.  When questioned, she identified community as "my precious people."  They are her mother and some neighbors down the road. But then expanded it to include mother's boyfriend and some other family friends.  Her mother is precious because "she gave birth to me even though it made her really sick and she then raised me."  "People who really care for you stay by you when you are really down."  [name] 08/02/2014 1:28 PM

## 2014-08-02 NOTE — Progress Notes (Signed)
[facility] Group Notes:  (Nursing/MHT/Case Management/Adjunct)  Date:  08/02/2014  Time:  9:53 PM  Type of Therapy:  Psychoeducational Skills  Participation Level:  Active  Participation Quality:  Appropriate  Affect:  Appropriate  Cognitive:  Appropriate  Insight:  Good  Engagement in Group:  Developing/Improving  Modes of Intervention:  Education  Summary of Progress/Problems: The patient shared in group this evening that she had a good day since she didn't "have any bad thoughts". The patient would not discuss that matter any further except to say that she also felt "lighter" today. As a theme for the day, her relapse prevention strategy will include not focusing on her bad thoughts.   Archie Balboa S 08/02/2014, 9:53 PM

## 2014-08-02 NOTE — Progress Notes (Signed)
[facility] MD Progress Note  08/02/2014 5:33 PM Jillian [name]  MRN:  [id number] Subjective:  Patient states she is starting to feel better. She states she feels more comfortable and less apprehensive on unit. Objective: I have discussed case with treatment team and have met with patient. Patient presents with partial improvement, and appears calmer, more at ease, and with a partially improved range of affect. At this time she denies any suicidal ideations and contracts for safety on unit. She has tolerated Zoloft ( and more recent addition of abilify as augmentation) well. She has been going to groups , and has been participative in milieu. Staff reports concern about possible eating disorder- I have discussed this with patient- she describes " not eating sometimes", and vomiting sometimes due to " stress" ( not self induced) , but denies any purging or binging at this time and none has been noted by staff. Her BMI is  20.2 and admit K+ was 3.5.    Diagnosis:  MDD, Anxiety Disorder NOS  Total Time spent with patient: 25 minutes    ADL's:  Improved   Sleep: good   Appetite:patient states appetite is "OK"- denies any purging or binging   Suicidal Ideation:  Today denies any suicidal ideations  Homicidal Ideation:  Denies  AEB (as evidenced by):  Psychiatric Specialty Exam: Physical Exam  ROS  Blood pressure 93/50, pulse 87, temperature 98.1 F (36.7 C), temperature source Oral, resp. rate 16, height 5' 3.25" (1.607 m), weight 52.164 kg (115 lb).Body mass index is 20.2 kg/(m^2).  General Appearance: improved grooming  Eye Contact::  Good  Speech:  Slow  Volume:  Decreased  Mood:  Depressed, but improved compared to admission  Affect:  less constricted, more reactive   Thought Process:  Linear  Orientation:  Full (Time, Place, and Person)  Thought Content:  denies hallucinations, no delusions  Suicidal Thoughts:  No  Homicidal Thoughts:  No  Memory:  recent and remote grossly intact   Judgement:  Fair  Insight:  Fair  Psychomotor Activity:  Normal  Concentration:  Good  Recall:  Good  Fund of Knowledge:Good  Language: Good  Akathisia:  No  Handed:  Right  AIMS (if indicated):     Assets:  Communication Skills Desire for Improvement Physical Health Resilience  Sleep:  Number of Hours: 6   Musculoskeletal: Strength & Muscle Tone: within normal limits Gait & Station: normal Patient leans: N/A  Current Medications: Current Facility-Administered Medications  Medication Dose Route Frequency Provider Last Rate Last Dose  . acetaminophen (TYLENOL) tablet 650 mg  650 mg Oral Q6H PRN Elmarie Shiley, NP      . alum & mag hydroxide-simeth (MAALOX/MYLANTA) 200-200-20 MG/5ML suspension 30 mL  30 mL Oral Q4H PRN Elmarie Shiley, NP      . ARIPiprazole (ABILIFY) tablet 2 mg  2 mg Oral Daily Jenne Campus, MD   2 mg at 08/02/14 0835  . feeding supplement (ENSURE COMPLETE) (ENSURE COMPLETE) liquid 237 mL  237 mL Oral BID BM Myer Peer Stephany Poorman, MD   237 mL at 08/02/14 1400  . hydrOXYzine (ATARAX/VISTARIL) tablet 25 mg  25 mg Oral Q6H PRN Elmarie Shiley, NP   25 mg at 07/31/14 2137  . magnesium hydroxide (MILK OF MAGNESIA) suspension 30 mL  30 mL Oral Daily PRN Elmarie Shiley, NP      . sertraline (ZOLOFT) tablet 100 mg  100 mg Oral BID Jenne Campus, MD   100 mg at 08/02/14 1642  .  traZODone (DESYREL) tablet 100 mg  100 mg Oral QHS PRN Fransisca Kaufmann, NP   100 mg at 08/01/14 2116    Lab Results:  Results for orders placed or performed during the hospital encounter of 07/31/14 (from the past 48 hour(s))  Glucose, capillary     Status: None   Collection Time: 07/31/14  5:36 PM  Result Value Ref Range   Glucose-Capillary 77 70 - 99 mg/dL   Comment 1 Notify RN   Urinalysis, Routine w reflex microscopic     Status: Abnormal   Collection Time: 07/31/14  6:18 PM  Result Value Ref Range   Color, Urine AMBER (A) YELLOW    Comment: BIOCHEMICALS MAY BE AFFECTED BY COLOR   APPearance HAZY  (A) CLEAR   Specific Gravity, Urine 1.027 1.005 - 1.030   pH 6.5 5.0 - 8.0   Glucose, UA NEGATIVE NEGATIVE mg/dL   Hgb urine dipstick SMALL (A) NEGATIVE   Bilirubin Urine NEGATIVE NEGATIVE   Ketones, ur NEGATIVE NEGATIVE mg/dL   Protein, ur NEGATIVE NEGATIVE mg/dL   Urobilinogen, UA 0.2 0.0 - 1.0 mg/dL   Nitrite NEGATIVE NEGATIVE   Leukocytes, UA NEGATIVE NEGATIVE    Comment: Performed at [facility]  Pregnancy, urine     Status: None   Collection Time: 07/31/14  6:18 PM  Result Value Ref Range   Preg Test, Ur NEGATIVE NEGATIVE    Comment:        THE SENSITIVITY OF THIS METHODOLOGY IS >20 mIU/mL. Performed at [facility]   Urine rapid drug screen (hosp performed)     Status: None   Collection Time: 07/31/14  6:18 PM  Result Value Ref Range   Opiates NONE DETECTED NONE DETECTED   Cocaine NONE DETECTED NONE DETECTED   Benzodiazepines NONE DETECTED NONE DETECTED   Amphetamines NONE DETECTED NONE DETECTED   Tetrahydrocannabinol NONE DETECTED NONE DETECTED   Barbiturates NONE DETECTED NONE DETECTED    Comment:        DRUG SCREEN FOR MEDICAL PURPOSES ONLY.  IF CONFIRMATION IS NEEDED FOR ANY PURPOSE, NOTIFY LAB WITHIN 5 DAYS.        LOWEST DETECTABLE LIMITS FOR URINE DRUG SCREEN Drug Class       Cutoff (ng/mL) Amphetamine      1000 Barbiturate      200 Benzodiazepine   200 Tricyclics       300 Opiates          300 Cocaine          300 THC              50 Performed at [facility]   Urine microscopic-add on     Status: Abnormal   Collection Time: 07/31/14  6:18 PM  Result Value Ref Range   Squamous Epithelial / LPF RARE RARE   WBC, UA 3-6 <3 WBC/hpf   RBC / HPF 3-6 <3 RBC/hpf   Bacteria, UA MANY (A) RARE    Comment: Performed at [facility]  CBC     Status: Abnormal   Collection Time: 07/31/14  7:35 PM  Result Value Ref Range   WBC 10.6 (H) 4.0 - 10.5 K/uL   RBC 4.28 3.87 - 5.11 MIL/uL    Hemoglobin 12.0 12.0 - 15.0 g/dL   HCT 48.9 73.5 - 72.4 %   MCV 86.7 78.0 - 100.0 fL   MCH 28.0 26.0 - 34.0 pg   MCHC 32.3 30.0 - 36.0 g/dL  RDW 12.8 11.5 - 15.5 %   Platelets 331 150 - 400 K/uL    Comment: Performed at [facility]  Comprehensive metabolic panel     Status: Abnormal   Collection Time: 07/31/14  7:35 PM  Result Value Ref Range   Sodium 141 137 - 147 mEq/L   Potassium 3.5 (L) 3.7 - 5.3 mEq/L   Chloride 101 96 - 112 mEq/L   CO2 29 19 - 32 mEq/L   Glucose, Bld 85 70 - 99 mg/dL   BUN 13 6 - 23 mg/dL   Creatinine, Ser 0.83 0.50 - 1.10 mg/dL   Calcium 9.8 8.4 - 10.5 mg/dL   Total Protein 8.0 6.0 - 8.3 g/dL   Albumin 4.1 3.5 - 5.2 g/dL   AST 17 0 - 37 U/L   ALT 12 0 - 35 U/L   Alkaline Phosphatase 96 39 - 117 U/L   Total Bilirubin 0.2 (L) 0.3 - 1.2 mg/dL   GFR calc non Af Amer >90 >90 mL/min   GFR calc Af Amer >90 >90 mL/min    Comment: (NOTE) The eGFR has been calculated using the CKD EPI equation. This calculation has not been validated in all clinical situations. eGFR's persistently <90 mL/min signify possible Chronic Kidney Disease.    Anion gap 11 5 - 15    Comment: Performed at [facility]  Ethanol     Status: None   Collection Time: 07/31/14  7:35 PM  Result Value Ref Range   Alcohol, Ethyl (B) <11 0 - 11 mg/dL    Comment:        LOWEST DETECTABLE LIMIT FOR SERUM ALCOHOL IS 11 mg/dL FOR MEDICAL PURPOSES ONLY Performed at [facility]   TSH     Status: None   Collection Time: 07/31/14  7:35 PM  Result Value Ref Range   TSH 0.914 0.350 - 4.500 uIU/mL    Comment: Performed at [facility]  Glucose, capillary     Status: None   Collection Time: 08/01/14  6:40 AM  Result Value Ref Range   Glucose-Capillary 73 70 - 99 mg/dL  Glucose, capillary     Status: None   Collection Time: 08/01/14  5:18 PM  Result Value Ref Range   Glucose-Capillary 78 70 - 99 mg/dL  Glucose, capillary      Status: None   Collection Time: 08/02/14  5:49 AM  Result Value Ref Range   Glucose-Capillary 86 70 - 99 mg/dL  Glucose, capillary     Status: None   Collection Time: 08/02/14  4:57 PM  Result Value Ref Range   Glucose-Capillary 97 70 - 99 mg/dL   Comment 1 Notify RN     Physical Findings: AIMS: Facial and Oral Movements Muscles of Facial Expression: None, normal Lips and Perioral Area: None, normal Jaw: None, normal Tongue: None, normal,Extremity Movements Upper (arms, wrists, hands, fingers): None, normal Lower (legs, knees, ankles, toes): None, normal, Trunk Movements Neck, shoulders, hips: None, normal, Overall Severity Severity of abnormal movements (highest score from questions above): None, normal Incapacitation due to abnormal movements: None, normal Patient's awareness of abnormal movements (rate only patient's report): No Awareness, Dental Status Current problems with teeth and/or dentures?: No Does patient usually wear dentures?: No  CIWA:    COWS:     Assessment: Patient is improved compared to admission and presents with a partially improved mood and affect. She is denying any medication side effects from her medication regimen at  this time. She denies any SI.   Treatment Plan Summary: Daily contact with patient to assess and evaluate symptoms and progress in treatment Medication management See below  Plan: Continue inpatient treatment and support Continue Abilify 2 mgrs QDAY  ContinueZoloft 100 mgrs BID Continue Trazodone 100 mgrs QHS PRN Insomnia Will recheck BMP.    Medical Decision Making Problem Points:  Established problem, stable/improving (1), Review of last therapy session (1) and Review of psycho-social stressors (1) Data Points:  Review or order clinical lab tests (1) Review of medication regiment & side effects (2)  I certify that inpatient services furnished can reasonably be expected to improve the patient's condition.   Christyna Letendre,  [place] 08/02/2014, 5:33 PM

## 2014-08-03 DIAGNOSIS — F419 Anxiety disorder, unspecified: Secondary | ICD-10-CM

## 2014-08-03 DIAGNOSIS — F329 Major depressive disorder, single episode, unspecified: Secondary | ICD-10-CM

## 2014-08-03 LAB — BASIC METABOLIC PANEL
ANION GAP: 10 (ref 5–15)
BUN: 16 mg/dL (ref 6–23)
CALCIUM: 9.4 mg/dL (ref 8.4–10.5)
CHLORIDE: 105 meq/L (ref 96–112)
CO2: 26 meq/L (ref 19–32)
CREATININE: 0.7 mg/dL (ref 0.50–1.10)
GFR calc Af Amer: >90 mL/min (ref >90)
GFR calc non Af Amer: >90 mL/min (ref >90)
Glucose, Bld: 77 mg/dL (ref 70–99)
Potassium: 4.2 mEq/L (ref 3.7–5.3)
Sodium: 141 mEq/L (ref 137–147)

## 2014-08-03 LAB — GLUCOSE, CAPILLARY
GLUCOSE-CAPILLARY: 79 mg/dL (ref 70–99)
GLUCOSE-CAPILLARY: 112 mg/dL — AB (ref 70–99)

## 2014-08-03 MED ORDER — NICOTINE POLACRILEX 2 MG MT GUM: 2.0000 mg | CHEWING_GUM | OROMUCOSAL | Status: DC | PRN

## 2014-08-03 NOTE — Progress Notes (Signed)
Patient ID: Jillian [name], female   DOB: 02-23-1996, 18 y.o.   MRN: [id number] [facility] MD Progress Note  08/03/2014 1:23 PM CARRYE GOLLER  MRN:  [id number] Subjective:  Patient states she is starting to feel better. She states she feels more comfortable and less apprehensive on unit.  She is smiling today.   Objective: I have discussed case with treatment team and have met with patient. Patient presents with partial improvement, and appears calmer, more at ease, and with a partially improved range of affect. At this time she denies any suicidal ideations and contracts for safety on unit. She has tolerated Zoloft ( and more recent addition of abilify as augmentation) well. She has been going to groups , and has been participative in milieu. She denies loss of appetite and is eating well.  Stated that during a family visit, it was commented that her face appears full (like she gained weight).   Diagnosis:  MDD, Anxiety Disorder NOS  Total Time spent with patient: 25 minutes  ADL's:  Improved   Sleep: good   Appetite:patient states appetite is "OK"- denies any purging or binging   Suicidal Ideation:  Today denies any suicidal ideations  Homicidal Ideation:  Denies  AEB (as evidenced by):  Psychiatric Specialty Exam: Physical Exam  Review of Systems  Constitutional: Negative.   HENT: Negative.   Eyes: Negative.   Respiratory: Negative.   Cardiovascular: Negative.   Gastrointestinal: Negative.   Genitourinary: Negative.   Musculoskeletal: Negative.   Skin: Negative.   Neurological: Negative.   Endo/Heme/Allergies: Negative.   Psychiatric/Behavioral: Positive for depression (Rates 2/10). Negative for suicidal ideas, hallucinations, memory loss and substance abuse. The patient is nervous/anxious (Rates 2/10). The patient does not have insomnia.     Blood pressure 98/57, pulse 86, temperature 98 F (36.7 C), temperature source Oral, resp. rate 16, height 5' 3.25" (1.607 m), weight 52.164  kg (115 lb).Body mass index is 20.2 kg/(m^2).  General Appearance: improved grooming  Eye Contact::  Good  Speech:  Slow  Volume:  Decreased  Mood:  Depressed, but improved compared to admission  Affect:  less constricted, more reactive   Thought Process:  Linear  Orientation:  Full (Time, Place, and Person)  Thought Content:  denies hallucinations, no delusions  Suicidal Thoughts:  No  Homicidal Thoughts:  No  Memory:  recent and remote grossly intact  Judgement:  Fair  Insight:  Fair  Psychomotor Activity:  Normal  Concentration:  Good  Recall:  Good  Fund of Knowledge:Good  Language: Good  Akathisia:  No  Handed:  Right  AIMS (if indicated):     Assets:  Communication Skills Desire for Improvement Physical Health Resilience  Sleep:  Number of Hours: 6.25   Musculoskeletal: Strength & Muscle Tone: within normal limits Gait & Station: normal Patient leans: N/A  Current Medications: Current Facility-Administered Medications  Medication Dose Route Frequency Provider Last Rate Last Dose  . acetaminophen (TYLENOL) tablet 650 mg  650 mg Oral Q6H PRN Elmarie Shiley, NP      . alum & mag hydroxide-simeth (MAALOX/MYLANTA) 200-200-20 MG/5ML suspension 30 mL  30 mL Oral Q4H PRN Elmarie Shiley, NP      . ARIPiprazole (ABILIFY) tablet 2 mg  2 mg Oral Daily Jenne Campus, MD   2 mg at 08/03/14 0909  . feeding supplement (ENSURE COMPLETE) (ENSURE COMPLETE) liquid 237 mL  237 mL Oral BID BM Myer Peer Cobos, MD   237 mL at 08/03/14 0910  .  hydrOXYzine (ATARAX/VISTARIL) tablet 25 mg  25 mg Oral Q6H PRN Elmarie Shiley, NP   25 mg at 07/31/14 2137  . magnesium hydroxide (MILK OF MAGNESIA) suspension 30 mL  30 mL Oral Daily PRN Elmarie Shiley, NP      . sertraline (ZOLOFT) tablet 100 mg  100 mg Oral BID Jenne Campus, MD   100 mg at 08/03/14 0909  . traZODone (DESYREL) tablet 100 mg  100 mg Oral QHS PRN Elmarie Shiley, NP   100 mg at 08/02/14 2115    Lab Results:  Results for orders placed or  performed during the hospital encounter of 07/31/14 (from the past 48 hour(s))  Glucose, capillary     Status: None   Collection Time: 08/01/14  5:18 PM  Result Value Ref Range   Glucose-Capillary 78 70 - 99 mg/dL  Glucose, capillary     Status: None   Collection Time: 08/02/14  5:49 AM  Result Value Ref Range   Glucose-Capillary 86 70 - 99 mg/dL  Glucose, capillary     Status: None   Collection Time: 08/02/14  4:57 PM  Result Value Ref Range   Glucose-Capillary 97 70 - 99 mg/dL   Comment 1 Notify RN   Glucose, capillary     Status: None   Collection Time: 08/02/14  8:39 PM  Result Value Ref Range   Glucose-Capillary 88 70 - 99 mg/dL   Comment 1 Notify RN   Glucose, capillary     Status: None   Collection Time: 08/03/14  6:02 AM  Result Value Ref Range   Glucose-Capillary 79 70 - 99 mg/dL  Basic metabolic panel     Status: None   Collection Time: 08/03/14  6:30 AM  Result Value Ref Range   Sodium 141 137 - 147 mEq/L   Potassium 4.2 3.7 - 5.3 mEq/L   Chloride 105 96 - 112 mEq/L   CO2 26 19 - 32 mEq/L   Glucose, Bld 77 70 - 99 mg/dL   BUN 16 6 - 23 mg/dL   Creatinine, Ser 0.70 0.50 - 1.10 mg/dL   Calcium 9.4 8.4 - 10.5 mg/dL   GFR calc non Af Amer >90 >90 mL/min   GFR calc Af Amer >90 >90 mL/min    Comment: (NOTE) The eGFR has been calculated using the CKD EPI equation. This calculation has not been validated in all clinical situations. eGFR's persistently <90 mL/min signify possible Chronic Kidney Disease.    Anion gap 10 5 - 15    Comment: Performed at [facility]    Physical Findings: AIMS: Facial and Oral Movements Muscles of Facial Expression: None, normal Lips and Perioral Area: None, normal Jaw: None, normal Tongue: None, normal,Extremity Movements Upper (arms, wrists, hands, fingers): None, normal Lower (legs, knees, ankles, toes): None, normal, Trunk Movements Neck, shoulders, hips: None, normal, Overall Severity Severity of abnormal  movements (highest score from questions above): None, normal Incapacitation due to abnormal movements: None, normal Patient's awareness of abnormal movements (rate only patient's report): No Awareness, Dental Status Current problems with teeth and/or dentures?: No Does patient usually wear dentures?: No  CIWA:    COWS:     Assessment: Patient is improved compared to admission and presents with a partially improved mood and affect. She is denying any medication side effects from her medication regimen at this time. She denies any SI.   Treatment Plan Summary: Daily contact with patient to assess and evaluate symptoms and progress in treatment Medication management  See below  Plan: Continue inpatient treatment and support Continue Abilify 2 mgrs QDAY  Continue Zoloft 100 mgrs BID Continue Trazodone 100 mgrs QHS PRN Insomnia  Review of chart, vital signs, medications, and notes.  1-Individual and group therapy  2-Medication management for depression and anxiety: Medications reviewed with the patient and she stated no untoward effects, unchanged.  See above 3-Coping skills for depression, anxiety  4-Continue crisis stabilization and management  5-Address health issues--monitoring vital signs, stable  6-Treatment plan in progress to prevent relapse of depression and anxiety   Medical Decision Making Problem Points:  Established problem, stable/improving (1), Review of last therapy session (1) and Review of psycho-social stressors (1) Data Points:  Review or order clinical lab tests (1) Review of medication regiment & side effects (2)  I certify that inpatient services furnished can reasonably be expected to improve the patient's condition.   Kerrie Buffalo MAY, AGNP-BC 08/03/2014, 1:23 PM

## 2014-08-03 NOTE — Progress Notes (Signed)
The focus of this group is to help patients review their daily goal of treatment and discuss progress on daily workbooks. Pt attended the evening group session and responded to all discussion prompts from the [place]. Pt shared that today was a good day on the unit, the highlight of which was having visitors come to see her. "My Dad even came, which surprised me as he hasn't been around much." Pt told the Writer she had no additional requests from Nursing Staff this evening. Pt's affect was appropriate and she volunteered positive comments to her peers during wrap-up.

## 2014-08-03 NOTE — Progress Notes (Signed)
Psychoeducational Group Note  Date:  08/03/2014 Time: 1015  Group Topic/Focus:  Identifying Needs:   The focus of this group is to help patients identify their personal needs that have been historically problematic and identify healthy behaviors to address their needs.  Participation Level:  Minimal  Participation Quality:  Attentive  Affect:  Appropriate  Cognitive:  Alert  Insight:  Engaged  Engagement in Group:  Engaged  Additional Comments:    08/03/2014,10:47 AM Rob Hickman Trixie Rude

## 2014-08-03 NOTE — BHH Group Notes (Signed)
Adult Psychoeducational Group Note  Date:  08/03/2014 Time:  4:49 PM  Group Topic/Focus:  Coping With Mental Health Crisis:   The purpose of this group is to help patients identify strategies for coping with mental health crisis.  Group discusses possible causes of crisis and ways to manage them effectively.  Participation Level:  Active  Participation Quality:  Appropriate and Attentive  Affect:  Appropriate  Cognitive:  Appropriate  Insight: Appropriate  Engagement in Group:  Engaged and Supportive  Modes of Intervention:  Discussion, Education, Problem-solving, Socialization and Support  Additional Comments:  Pt participated during the group discussion and shared with the group.  Harriet Masson 08/03/2014, 4:49 PM

## 2014-08-03 NOTE — Progress Notes (Signed)
[name] remains positive, she is cooperative, attending her planned groups, taking part in her groups and getting along well with her peers on the unit. She completes her morning assessment and on it she writes she denies SI within the past 24 hrs,. She says she would like to  Go home tomorrow " so I can get ready to return to classes on Monday". She rates her depression and hopelessness  And anxiety "0/0/3", respectively.  APt is educated about suicide prevention as well as SSRI therapy and states she understands.  R Safety in place.

## 2014-08-03 NOTE — Progress Notes (Signed)
Patient ID: Jillian [name], female   DOB: Dec 03, 1995, 18 y.o.   MRN: [id number] Pt visible in the milieu. Interacting appropriately with staff and peers. Needs assessed. Pt denied. Pt denied SI, HI and AVH. Fifteen minute checks in progress for patient safety. Pt safe on unit.

## 2014-08-03 NOTE — BHH Group Notes (Signed)
[place] LCSW Group Therapy  08/03/2014 12:41 PM  Type of Therapy:  Group Therapy  Participation Level:  Active  Participation Quality:  Appropriate and Attentive  Affect:  Appropriate and Flat  Cognitive:  Appropriate and Oriented  Insight:  Limited  Engagement in Therapy:  Engaged  Modes of Intervention:  Education  Summary of Progress/Problems: Today's group was about coping with life situations using a positive attitude. The group described attitude and identify both good and bad attitudes, the consequences and benefits of both.  Group further discussed which attitude to have and how to develop a good/positive attitude.  Closed by group sharing how each person can do something to have a better attitude.   Christene Lye MSW, LCSW   Lyla Glassing 08/03/2014, 12:41 PM

## 2014-08-04 LAB — GLUCOSE, CAPILLARY
GLUCOSE-CAPILLARY: 99 mg/dL (ref 70–99)
Glucose-Capillary: 84 mg/dL (ref 70–99)
Glucose-Capillary: 93 mg/dL (ref 70–99)
Glucose-Capillary: 99 mg/dL (ref 70–99)

## 2014-08-04 NOTE — Progress Notes (Signed)
Patient ID: Jillian [name], female   DOB: 02/23/1996, 18 y.o.   MRN: [id number]   D: Pt has been appropriate on the unit today, she has attended all groups and engaged in treatment. Pt reported that she was having a much better day, and reported no issues or concerns. Pt took all medications without any problems. Pt reported her depression as a 0, and her helplessness/hopelessness was a 0. Pt reported being negative SI/HI, no AH/VH noted. A: 15 min checks continued for patient safety. R: Pt safety maintained.

## 2014-08-04 NOTE — BHH Group Notes (Signed)
[facility] Group Notes:  (Nursing/MHT/Case Management/Adjunct)  Date:  08/04/2014  Time:  3:32 PM  Type of Therapy:  Psychoeducational Skills-Healthy Support Systems  Participation Level:  Active  Participation Quality:  Appropriate  Affect:  Appropriate  Cognitive:  Appropriate  Insight:  Appropriate  Engagement in Group:  Engaged  Modes of Intervention:  Discussion  Summary of Progress/Problems: Pt did attend healthy support systems group.   Benancio Deeds Shanta 08/04/2014, 3:32 PM

## 2014-08-04 NOTE — BHH Group Notes (Signed)
[place] LCSW Group Therapy  08/04/2014 3:14 PM  Type of Therapy:  Group Therapy  Participation Level:  Active  Participation Quality:  Appropriate, Sharing and Supportive  Affect:  Appropriate  Cognitive:  Appropriate  Insight:  Developing/Improving, Engaged and Supportive  Engagement in Therapy:  Developing/Improving, Engaged and Supportive  Modes of Intervention:  Discussion, Education, Exploration, Rapport Building and Support  Summary of Progress/Problems: Pt was able to participate in group appropriately. Pt was able to give examples of self-sabotaging behaviors and was able to support other discussion throughout group.   Katha Hamming 08/04/2014, 3:14 PM

## 2014-08-04 NOTE — Progress Notes (Signed)
Patient ID: Jillian [name], female   DOB: 30-Jul-1996, 18 y.o.   MRN: [id number] Patient ID: Jillian [name], female   DOB: 1996-08-26, 18 y.o.   MRN: [id number] [facility] MD Progress Note  08/04/2014 12:36 PM Jillian [name]  MRN:  [id number] Subjective:  Patient states she is starting to feel better.  I'm taking less Trazodone.  I'm more irritable today and I don't know why" Objective:  Jillian [name] appears as she had from yesterday.  She is calm and pleasant individual.  She articulates well how she is feeling that day.  Her dad visited her last night.  This is causing her to ruminate about her relationship with her dad.  She verbalized feelings of resentment (ie.  not being able to go to father/daughter school events when she was growing up).  She is wondering when she will be going home.  I have discussed case with treatment team and have met with patient.  At this time she denies any suicidal ideations and contracts for safety on unit. She has tolerated Zoloft ( and more recent addition of abilify as augmentation) well. She has been going to groups , and has been participative in milieu. She denies loss of appetite and is eating well.     Diagnosis:  MDD, Anxiety Disorder NOS  Total Time spent with patient: 25 minutes  ADL's:  Improved   Sleep: good   Appetite:patient states appetite is "OK"- denies any purging or binging   Suicidal Ideation:  Today denies any suicidal ideations  Homicidal Ideation:  Denies  AEB (as evidenced by):  Psychiatric Specialty Exam: Physical Exam  Vitals reviewed. Psychiatric: Her behavior is normal. Judgment and thought content normal. Her mood appears anxious.    Review of Systems  Constitutional: Negative.   HENT: Negative.   Eyes: Negative.   Respiratory: Negative.   Cardiovascular: Negative.   Gastrointestinal: Negative.   Genitourinary: Negative.   Musculoskeletal: Negative.   Skin: Negative.   Neurological: Negative.   Endo/Heme/Allergies: Negative.    Psychiatric/Behavioral: Positive for depression (Rates 2/10). Negative for suicidal ideas, hallucinations, memory loss and substance abuse. The patient is nervous/anxious (Rates 2/10). The patient does not have insomnia.     Blood pressure 95/49, pulse 71, temperature 97.6 F (36.4 C), temperature source Oral, resp. rate 18, height 5' 3.25" (1.607 m), weight 52.164 kg (115 lb).Body mass index is 20.2 kg/(m^2).  General Appearance: improved grooming  Eye Contact::  Good  Speech:  Slow  Volume:  Decreased  Mood:  Depressed, but improved compared to admission  Affect:  less constricted, more reactive   Thought Process:  Linear  Orientation:  Full (Time, Place, and Person)  Thought Content:  denies hallucinations, no delusions  Suicidal Thoughts:  No  Homicidal Thoughts:  No  Memory:  recent and remote grossly intact  Judgement:  Fair  Insight:  Fair  Psychomotor Activity:  Normal  Concentration:  Good  Recall:  Good  Fund of Knowledge:Good  Language: Good  Akathisia:  No  Handed:  Right  AIMS (if indicated):     Assets:  Communication Skills Desire for Improvement Physical Health Resilience  Sleep:  Number of Hours: 6.5   Musculoskeletal: Strength & Muscle Tone: within normal limits Gait & Station: normal Patient leans: N/A  Current Medications: Current Facility-Administered Medications  Medication Dose Route Frequency Provider Last Rate Last Dose  . acetaminophen (TYLENOL) tablet 650 mg  650 mg Oral Q6H PRN Elmarie Shiley, NP      .  alum & mag hydroxide-simeth (MAALOX/MYLANTA) 200-200-20 MG/5ML suspension 30 mL  30 mL Oral Q4H PRN Elmarie Shiley, NP      . ARIPiprazole (ABILIFY) tablet 2 mg  2 mg Oral Daily Jenne Campus, MD   2 mg at 08/04/14 0844  . feeding supplement (ENSURE COMPLETE) (ENSURE COMPLETE) liquid 237 mL  237 mL Oral BID BM Myer Peer Cobos, MD   237 mL at 08/04/14 0844  . hydrOXYzine (ATARAX/VISTARIL) tablet 25 mg  25 mg Oral Q6H PRN Elmarie Shiley, NP   25 mg at  07/31/14 2137  . magnesium hydroxide (MILK OF MAGNESIA) suspension 30 mL  30 mL Oral Daily PRN Elmarie Shiley, NP      . nicotine polacrilex (NICORETTE) gum 2 mg  2 mg Oral PRN Dara Hoyer, PA-C      . sertraline (ZOLOFT) tablet 100 mg  100 mg Oral BID Jenne Campus, MD   100 mg at 08/04/14 0844  . traZODone (DESYREL) tablet 100 mg  100 mg Oral QHS PRN Elmarie Shiley, NP   100 mg at 08/03/14 2138    Lab Results:  Results for orders placed or performed during the hospital encounter of 07/31/14 (from the past 48 hour(s))  Glucose, capillary     Status: None   Collection Time: 08/02/14  4:57 PM  Result Value Ref Range   Glucose-Capillary 97 70 - 99 mg/dL   Comment 1 Notify RN   Glucose, capillary     Status: None   Collection Time: 08/02/14  8:39 PM  Result Value Ref Range   Glucose-Capillary 88 70 - 99 mg/dL   Comment 1 Notify RN   Glucose, capillary     Status: None   Collection Time: 08/03/14  6:02 AM  Result Value Ref Range   Glucose-Capillary 79 70 - 99 mg/dL  Basic metabolic panel     Status: None   Collection Time: 08/03/14  6:30 AM  Result Value Ref Range   Sodium 141 137 - 147 mEq/L   Potassium 4.2 3.7 - 5.3 mEq/L   Chloride 105 96 - 112 mEq/L   CO2 26 19 - 32 mEq/L   Glucose, Bld 77 70 - 99 mg/dL   BUN 16 6 - 23 mg/dL   Creatinine, Ser 0.70 0.50 - 1.10 mg/dL   Calcium 9.4 8.4 - 10.5 mg/dL   GFR calc non Af Amer >90 >90 mL/min   GFR calc Af Amer >90 >90 mL/min    Comment: (NOTE) The eGFR has been calculated using the CKD EPI equation. This calculation has not been validated in all clinical situations. eGFR's persistently <90 mL/min signify possible Chronic Kidney Disease.    Anion gap 10 5 - 15    Comment: Performed at [facility]  Glucose, capillary     Status: Abnormal   Collection Time: 08/03/14  9:02 PM  Result Value Ref Range   Glucose-Capillary 112 (H) 70 - 99 mg/dL   Comment 1 Notify RN   Glucose, capillary     Status: None    Collection Time: 08/04/14  6:16 AM  Result Value Ref Range   Glucose-Capillary 84 70 - 99 mg/dL  Glucose, capillary     Status: None   Collection Time: 08/04/14 11:39 AM  Result Value Ref Range   Glucose-Capillary 99 70 - 99 mg/dL    Physical Findings: AIMS: Facial and Oral Movements Muscles of Facial Expression: None, normal Lips and Perioral Area: None, normal Jaw: None, normal Tongue:  None, normal,Extremity Movements Upper (arms, wrists, hands, fingers): None, normal Lower (legs, knees, ankles, toes): None, normal, Trunk Movements Neck, shoulders, hips: None, normal, Overall Severity Severity of abnormal movements (highest score from questions above): None, normal Incapacitation due to abnormal movements: None, normal Patient's awareness of abnormal movements (rate only patient's report): No Awareness, Dental Status Current problems with teeth and/or dentures?: No Does patient usually wear dentures?: No  CIWA:    COWS:     Assessment: Patient is improved compared to admission and presents with a partially improved mood and affect. She is denying any medication side effects from her medication regimen at this time. She denies any SI.   Treatment Plan Summary: Daily contact with patient to assess and evaluate symptoms and progress in treatment Medication management See below  Plan: Continue inpatient treatment and support Continue Abilify 2 mgrs QDAY  Continue Zoloft 100 mgrs BID Continue Trazodone 100 mgrs QHS PRN Insomnia Will reassess fitness for discharge in the am and follow up on CSW planning.  Review of chart, vital signs, medications, and notes.  1-Individual and group therapy  2-Medication management for depression and anxiety: Medications reviewed with the patient and she stated no untoward effects, unchanged.  See above 3-Coping skills for depression, anxiety  4-Continue crisis stabilization and management  5-Address health issues--monitoring vital signs,  stable  6-Treatment plan in progress to prevent relapse of depression and anxiety   Medical Decision Making Problem Points:  Established problem, stable/improving (1), Review of last therapy session (1) and Review of psycho-social stressors (1) Data Points:  Review or order clinical lab tests (1) Review of medication regiment & side effects (2)  I certify that inpatient services furnished can reasonably be expected to improve the patient's condition.   Kerrie Buffalo MAY, AGNP-BC 08/04/2014, 12:36 PM

## 2014-08-04 NOTE — Progress Notes (Signed)
D: Pt mood is depressed but she brightens upon approach. Stated that her day was good.  A: Support given. Verbalization encouraged. Medications given as prescribed. Pt encouraged to come to staff with any concerns.  R: Pt is receptive. Pt remains safe on the unit. Q38min safety checks maintained. Will continue to monitor pt.

## 2014-08-04 NOTE — Progress Notes (Signed)
[facility] Group Notes:  (Nursing/MHT/Case Management/Adjunct)  Date:  08/04/2014  Time:  11:53 PM  Type of Therapy:  Psychoeducational Skills  Participation Level:  Minimal  Participation Quality:  Attentive  Affect:  Appropriate  Cognitive:  Appropriate  Insight:  Limited  Engagement in Group:  Limited  Modes of Intervention:  Education  Summary of Progress/Problems: The patient indicated that she felt irritable today for some unknown reason. She anticipates being discharged either Monday or Tuesday of this week. As a theme for the day, her support system will consist of her mother and a few friends.   Archie Balboa S 08/04/2014, 11:53 PM

## 2014-08-04 NOTE — BHH Group Notes (Signed)
[facility] Group Notes:  (Nursing/MHT/Case Management/Adjunct)  Date:  08/04/2014  Time:  3:09 PM  Type of Therapy:  Psychoeducational Skills-Pt self inventory group   Participation Level:  Active  Participation Quality:  Appropriate  Affect:  Appropriate  Cognitive:  Appropriate  Insight:  Appropriate  Engagement in Group:  Engaged  Modes of Intervention:  Discussion  Summary of Progress/Problems: Pt did attend self inventory group.  Jillian [name] Shanta 08/04/2014, 3:09 PM

## 2014-08-05 DIAGNOSIS — F331 Major depressive disorder, recurrent, moderate: Secondary | ICD-10-CM | POA: Insufficient documentation

## 2014-08-05 LAB — GLUCOSE, CAPILLARY: Glucose-Capillary: 65 mg/dL — ABNORMAL LOW (ref 70–99)

## 2014-08-05 MED ORDER — SERTRALINE HCL 100 MG PO TABS: 100.0000 mg | ORAL_TABLET | Freq: Two times a day (BID) | ORAL | Status: DC

## 2014-08-05 MED ORDER — ARIPIPRAZOLE 2 MG PO TABS: 2.0000 mg | ORAL_TABLET | Freq: Every day | ORAL | Status: AC

## 2014-08-05 MED ORDER — TRAZODONE HCL 100 MG PO TABS: 100.0000 mg | ORAL_TABLET | Freq: Every evening | ORAL | Status: AC | PRN

## 2014-08-05 MED ORDER — HYDROXYZINE HCL 25 MG PO TABS: 25.0000 mg | ORAL_TABLET | Freq: Four times a day (QID) | ORAL | Status: DC | PRN

## 2014-08-05 MED ORDER — NICOTINE POLACRILEX 2 MG MT GUM: 2.0000 mg | CHEWING_GUM | OROMUCOSAL | Status: DC | PRN

## 2014-08-05 NOTE — Tx Team (Signed)
  Interdisciplinary Treatment Plan Update   Date Reviewed:  08/05/2014  Time Reviewed:  2:51 PM  Progress in Treatment:   Attending groups: Yes Participating in groups: Yes Taking medication as prescribed: Yes  Tolerating medication: Yes Family/Significant other contact made: Yes  Patient understands diagnosis: Yes  Discussing patient identified problems/goals with staff: Yes  See initial care plan Medical problems stabilized or resolved: Yes Denies suicidal/homicidal ideation: Yes  In tx team Patient has not harmed self or others: Yes  For review of initial/current patient goals, please see plan of care.  Estimated Length of Stay:  D/C today  Reason for Continuation of Hospitalization:   New Problems/Goals identified:  N/A  Discharge Plan or Barriers:   return home for a couple of days before returning to school, and follow up outpt  Additional Comments:  Attendees:  Signature: Steva Colder, MD 08/05/2014 2:51 PM   Signature: Ripley Fraise, [place] 08/05/2014 2:51 PM  Signature: 08/05/2014 2:51 PM  Signature:  08/05/2014 2:51 PM  Signature: Darrol Angel, RN 08/05/2014 2:51 PM  Signature:  08/05/2014 2:51 PM  Signature:   08/05/2014 2:51 PM  Signature:    Signature:    Signature:    Signature:    Signature:    Signature:      Scribe for Treatment Team:   Ripley Fraise, LCSW  08/05/2014 2:51 PM

## 2014-08-05 NOTE — B[facility] Group Notes (Signed)
[place] LCSW Group Therapy  08/05/2014 1:39 PM  Type of Therapy:  Group Therapy  Participation Level:  Active  Participation Quality:  Attentive  Affect:  Flat  Cognitive:  Alert  Insight:  Improving  Engagement in Therapy:  Improving  Modes of Intervention:  Confrontation, Discussion, Education, Exploration, Limit-setting, Problem-solving, Rapport Building, Socialization and Support  Summary of Progress/Problems: Today's Topic: Overcoming Obstacles. Group members identified obstacles faced currently and processed barriers involved in overcoming these obstacles. Group members identified steps necessary for overcoming these obstacles and explored motivation (internal and external) for facing these difficulties head on. Group members further identified one area of concern in their lives and chose a skill of focus pulled from their "toolbox." [place] shared that "asking for help is scary" and stated that she works hard to not let her pride get in the way of asking for help. Dharma was attentive during group but did not actively participate in group discussion unless invited by CSW. [place]ina was pleasant and actively listened to others speak.   Smart, Sharada Albornoz LCSWA 08/05/2014, 1:39 PM

## 2014-08-05 NOTE — [facility] Suicide Risk Assessment (Signed)
   Demographic Factors:  Adolescent or young adult  Total Time spent with patient: 45 minutes  Psychiatric Specialty Exam: Physical Exam  ROS  Blood pressure 100/55, pulse 81, temperature 98.2 F (36.8 C), temperature source Oral, resp. rate 18, height 5' 3.25" (1.607 m), weight 52.164 kg (115 lb).Body mass index is 20.2 kg/(m^2).  General Appearance: Casual  Eye Contact::  Fair  Speech:  Clear and Coherent  Volume:  Normal  Mood:  Euthymic  Affect:  Appropriate  Thought Process:  Coherent  Orientation:  Full (Time, Place, and Person)  Thought Content:  WDL  Suicidal Thoughts:  No  Homicidal Thoughts:  No  Memory:  Immediate;   Good Recent;   Good Remote;   Good  Judgement:  Fair  Insight:  Fair  Psychomotor Activity:  Normal  Concentration:  Fair  Recall:  Good  Fund of Knowledge:Good  Language: Good  Akathisia:  No  Handed:  Right  AIMS (if indicated):     Assets:  Communication Skills Desire for Improvement Physical Health Social Support Talents/Skills Transportation Vocational/Educational  Sleep:  Number of Hours: 6.5    Musculoskeletal: Strength & Muscle Tone: within normal limits Gait & Station: normal Patient leans: N/A   Mental Status Per Nursing Assessment::   On Admission:  Self-harm thoughts  Current Mental Status by Physician: Patient reports her medications as helping.Pt denies SI/HI/AH/VH.  Loss Factors: NA  Historical Factors: Impulsivity  Risk Reduction Factors:   Religious beliefs about death, Positive social support and Positive therapeutic relationship  Continued Clinical Symptoms:  Depression:  Improving  Cognitive Features That Contribute To Risk:  Patient is alert,oriented x3 ,memory is grossly intact. Attention and concentration is good.    Suicide Risk:  Minimal: No identifiable suicidal ideation.    Discharge Diagnoses:  Primary Psychiatric Diagnosis: Major Depressive Disorder,moderate, recurrent episode ,with  anxious distress (IMPROVING)    Non Psychiatric Diagnosis: See PMH  Past Medical History  Diagnosis Date  . Hypoglycemia   . Anemia   . Anxiety   . Depression   . OCD (obsessive compulsive disorder)    Plan Of Care/Follow-up recommendations:  Activity:  no restrictions Diet:  regular  Is patient on multiple antipsychotic therapies at discharge:  No   Has Patient had three or more failed trials of antipsychotic monotherapy by history:  No  Recommended Plan for Multiple Antipsychotic Therapies: NA    [place] Skalicky MD 08/05/2014, 10:07 AM

## 2014-08-05 NOTE — Progress Notes (Signed)
Patient ID: Jillian [name], female   DOB: 10-29-1995, 18 y.o.   MRN: [id number] D: Client visible on the unit, seen in dayroom reading, little interaction noted. Client reports "feel good" hoping to get back to school in time to register late for some of her classes, denies SHI, AVH. A: Writer introduced self to client, provided emotional support, encouraged her to report concern to nurse. R: Staff will monitor q25min for safety, attended group.

## 2014-08-05 NOTE — Progress Notes (Signed)
[facility] Adult Case Management Discharge Plan :  Will you be returning to the same living situation after discharge: Yes,  home, then school At discharge, do you have transportation home?:Yes,  mother Do you have the ability to pay for your medications:Yes,  insurance  Release of information consent forms completed and in the chart;  Patient's signature needed at discharge.  Patient to Follow up at: Follow-up Information    Follow up with [facility]  On 09/05/2014.   Why:  Thrursday December 10th at 6:00 with Dr. Ronnald Ramp for medication management    Contact information:   [place], [address], [place] [address] Phone: [phone] Fax: [phone]      Follow up with [facility]  On 08/29/2014.   Why:  Thursday Decemeber 3rd at 6:30 with Wells Guiles for counseling.    Contact information:   [place], [address], [place] [address] Phone: [phone] Fax: [phone]      Patient denies SI/HI:   Yes,  yes    Safety Planning and Suicide Prevention discussed:  Yes,  yes  Trish Mage 08/05/2014, 2:52 PM

## 2014-08-05 NOTE — Progress Notes (Signed)
Patient ID: Jillian [name], female   DOB: 1996/08/24, 18 y.o.   MRN: [id number] NSG D/C Note:Pt denies si/hi and at this time. States that she will comply with outpt services and take her meds as prescribed. D/C to home this afternoon with mother.

## 2014-08-06 DIAGNOSIS — F331 Major depressive disorder, recurrent, moderate: Secondary | ICD-10-CM

## 2014-08-06 NOTE — Discharge Summary (Signed)
Physician Discharge Summary Note  Patient:  Jillian [name] is an 18 y.o., female MRN:  [id number] DOB:  09-Dec-1995 Patient phone:  [phone] (home)  Patient address:   [address] [place] [address],  Total Time spent with patient: 45 minutes  Date of Admission:  07/31/2014 Date of Discharge: 08/05/2014  Reason for Admission:  Depression Discharge Diagnoses:  Major Depressive Disorder,moderate, recurrent episode ,with anxious distress (IMPROVING)  Depression Active Problems:   MDD (major depressive disorder)   Major depressive disorder, recurrent, severe without psychotic features   MDD (major depressive disorder), recurrent episode, moderate   Psychiatric Specialty Exam: Physical Exam  Vitals reviewed. Psychiatric: She has a normal mood and affect. Her behavior is normal. Judgment and thought content normal.    Review of Systems  Psychiatric/Behavioral: Positive for depression (hx of, chronic, stabilized ). Negative for suicidal ideas, hallucinations, memory loss and substance abuse. The patient is nervous/anxious (hx of, chronic, stabilized). The patient does not have insomnia.     Blood pressure 100/55, pulse 81, temperature 98.2 F (36.8 C), temperature source Oral, resp. rate 18, height 5' 3.25" (1.607 m), weight 52.164 kg (115 lb).Body mass index is 20.2 kg/(m^2).   Past Psychiatric History: Diagnosis:  Hospitalizations:  Outpatient Care: Therapist Ky Barban and Psychiatrist Marcy Salvo MD)-[facility]  Substance Abuse Care: NA  Self-Mutilation: Did not endorse  Suicidal Attempts: OD on meds  Violent Behaviors: Did not endorse   Musculoskeletal: Strength & Muscle Tone: within normal limits Gait & Station: normal Patient leans: N/A  DSM5: Primary psychiatric diagnosis: Major Depressive Disorder,moderate, recurrent episode ,with anxious distress (IMPROVING)   Past Medical History  Diagnosis Date  . Hypoglycemia   .  Anemia   . Anxiety   . Depression   . OCD (obsessive compulsive disorder)    Level of Care:  OP  Hospital Course:  AAMIRA BISCHOFF is an 18 y.o. 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 She presented to [facility] accompanied by her mother. Pt presents with C/O increased depression, anxiety and SI with a plan to overdose on her medication. "I usually have suicidal thoughts but I give myself 24 hrs to see if I can overcome the feelings. I always have gotten over it." Pt reports feeling overwhelmed about her academics and reports that she is not doing well. Pt reports that she consulted with her professor today about her concerns regarding dropping a class, having some insomnia and poor appetite. Pt reports that she has been compliant with her medication but mother reports that patient has had periods over the past month where she has not taken her psychiatric medications consistently. Pt reports that she has had previous issues with weight loss and not eating and implies that she may have an eating disorder(no binging or purging reporting) but has never received treatment or been officially diagnosed with one. Pt and mother reports that pt's psychiatrist Marcy Salvo at [facility] group are aware and have addressed her potential issues regarding possible eating disorder. Pt reports that she has been on a downward spiral for the past month and her symptoms are getting worse. Pt reports low energy and feeling fatigue. Pt's mother reports that she is concerned about patient's safety due to remarks of wanting to commit suicide. Pt' s mother reports that she just wants patient to develop and healthy sense of self and feel like she deserves to live. Pt is unable to contract for safety and inpatient treatment is  recommended for safety and stabilization.  Jillian [name] benefited from inpatient psychiatric treatment for safety and stabilization.  As part of treatment plan, medication management and  group therapy were necessary to help re-stabilize [name]'s moods.  Each day, it was observed that [place] improved.  She is a quiet but articulate individual.  She was quite aware of her social anxiety.  She appeared goal oriented and seemed genuinely invested in the meds and prescribed for her.  She tolerated and reported no adverse side effects.  At time of discharge, she was pleasant and in good spirits.  She rated both depression and anxiety levels to be manageable and minimal.  Early on, she was able to identify the triggers of her social and emotional anxieties.  Denies physiological concerns/SI/HI/AVH at time of discharge.  She has satisfactory support network and home environment and will adhere to medication compliance and outpatient treatment at [facility].    Consults:  psychiatry  Significant Diagnostic Studies:  labs: per ED  Discharge Vitals:   Blood pressure 100/55, pulse 81, temperature 98.2 F (36.8 C), temperature source Oral, resp. rate 18, height 5' 3.25" (1.607 m), weight 52.164 kg (115 lb). Body mass index is 20.2 kg/(m^2). Lab Results:   Results for orders placed or performed during the hospital encounter of 07/31/14 (from the past 72 hour(s))  Glucose, capillary     Status: Abnormal   Collection Time: 08/03/14  9:02 PM  Result Value Ref Range   Glucose-Capillary 112 (H) 70 - 99 mg/dL   Comment 1 Notify RN   Glucose, capillary     Status: None   Collection Time: 08/04/14  6:16 AM  Result Value Ref Range   Glucose-Capillary 84 70 - 99 mg/dL  Glucose, capillary     Status: None   Collection Time: 08/04/14 11:39 AM  Result Value Ref Range   Glucose-Capillary 99 70 - 99 mg/dL  Glucose, capillary     Status: None   Collection Time: 08/04/14  4:54 PM  Result Value Ref Range   Glucose-Capillary 93 70 - 99 mg/dL  Glucose, capillary     Status: None   Collection Time: 08/04/14  8:35 PM  Result Value Ref Range   Glucose-Capillary 99 70 - 99 mg/dL  Glucose, capillary      Status: Abnormal   Collection Time: 08/05/14  6:13 AM  Result Value Ref Range   Glucose-Capillary 65 (L) 70 - 99 mg/dL   Comment 1 Notify RN     Physical Findings: AIMS: Facial and Oral Movements Muscles of Facial Expression: None, normal Lips and Perioral Area: None, normal Jaw: None, normal Tongue: None, normal,Extremity Movements Upper (arms, wrists, hands, fingers): None, normal Lower (legs, knees, ankles, toes): None, normal, Trunk Movements Neck, shoulders, hips: None, normal, Overall Severity Severity of abnormal movements (highest score from questions above): None, normal Incapacitation due to abnormal movements: None, normal Patient's awareness of abnormal movements (rate only patient's report): No Awareness, Dental Status Current problems with teeth and/or dentures?: No Does patient usually wear dentures?: No  CIWA:    COWS:     Psychiatric Specialty Exam: See Psychiatric Specialty Exam and Suicide Risk Assessment completed by Attending Physician prior to discharge.  Discharge destination:  Home  Is patient on multiple antipsychotic therapies at discharge:  No   Has Patient had three or more failed trials of antipsychotic monotherapy by history:  No  Recommended Plan for Multiple Antipsychotic Therapies: NA     Medication List    TAKE these  medications      Indication   ARIPiprazole 2 MG tablet  Commonly known as:  ABILIFY  Take 1 tablet (2 mg total) by mouth daily.   Indication:  Moos Stabilization     etonogestrel 68 MG Impl implant  Commonly known as:  NEXPLANON  1 each by Subdermal route once.      hydrOXYzine 25 MG tablet  Commonly known as:  ATARAX/VISTARIL  Take 1 tablet (25 mg total) by mouth every 6 (six) hours as needed for anxiety.   Indication:  Anxiety Neurosis     Mefenamic Acid 250 MG Caps  Take 2 capsules by mouth 3 (three) times daily as needed (For menstrual pain.).      nicotine polacrilex 2 MG gum  Commonly known as:  NICORETTE   Take 1 each (2 mg total) by mouth as needed for smoking cessation.   Indication:  Nicotine Addiction     PATADAY 0.2 % Soln  Generic drug:  Olopatadine HCl  Place 2 drops into both eyes daily as needed (For allergies.).      senna 8.6 MG tablet  Commonly known as:  SENOKOT  Take 1 tablet by mouth daily as needed for constipation.      sertraline 100 MG tablet  Commonly known as:  ZOLOFT  Take 1 tablet (100 mg total) by mouth 2 (two) times daily.   Indication:  Major Depressive Disorder     traZODone 100 MG tablet  Commonly known as:  DESYREL  Take 1 tablet (100 mg total) by mouth at bedtime as needed for sleep.   Indication:  Trouble Sleeping           Follow-up Information    Follow up with [facility]  On 09/05/2014.   Why:  Thrursday December 10th at 6:00 with Dr. Ronnald Ramp for medication management    Contact information:   [place], [address], [place] [address] Phone: [phone] Fax: [phone]      Follow up with [facility]  On 08/29/2014.   Why:  Thursday Decemeber 3rd at 6:30 with Wells Guiles for counseling.    Contact information:   [place], [address], [place] [address] Phone: [phone] Fax: [phone]      Follow-up recommendations:  Activity:  as tolerated.  Diet as tolerated  Comments:  Take all medications as prescribed. Keep all follow-up appointments as scheduled.  Do not consume alcohol or use illegal drugs while on prescription medications. Report any adverse effects from your medications to your primary care provider promptly.  In the event of recurrent symptoms or worsening symptoms, call 911, a crisis hotline, or go to the nearest emergency department for evaluation.   Total Discharge Time:  Greater than 30 minutes.  SignedKerrie Buffalo MAY, AGNP-BC 08/06/2014, 9:39 AM   Patient was seen face to face for psychiatric evaluation, suicide risk assessment and case discussed with treatment team  and NP and made appropriate disposition plans. Reviewed the information documented and agree with the treatment plan.    Ursula Alert ,MD Attending [facility]

## 2014-08-09 NOTE — Progress Notes (Signed)
Patient Discharge Instructions:  After Visit Summary (AVS):   Faxed to:  08/09/14 Discharge Summary Note:   Faxed to:  08/09/14 Psychiatric Admission Assessment Note:   Faxed to:  08/09/14 Suicide Risk Assessment - Discharge Assessment:   Faxed to:  08/09/14 Faxed/Sent to the Next Level Care provider:  08/09/14 Faxed to [facility] @ [facility], 08/09/2014, 2:38 PM

## 2014-09-12 ENCOUNTER — Ambulatory Visit: Admitting: Obstetrics & Gynecology

## 2014-09-16 ENCOUNTER — Ambulatory Visit: Admitting: Obstetrics & Gynecology

## 2014-09-18 ENCOUNTER — Encounter: Payer: Self-pay | Admitting: Obstetrics & Gynecology

## 2014-09-18 ENCOUNTER — Ambulatory Visit: Admitting: Obstetrics & Gynecology

## 2014-09-18 ENCOUNTER — Ambulatory Visit (INDEPENDENT_AMBULATORY_CARE_PROVIDER_SITE_OTHER): Admitting: Obstetrics & Gynecology

## 2014-09-18 VITALS — BP 100/61 | HR 87 | Temp 98.7°F | Wt 125.0 lb

## 2014-09-18 DIAGNOSIS — Z30013 Encounter for initial prescription of injectable contraceptive: Secondary | ICD-10-CM

## 2014-09-18 DIAGNOSIS — N939 Abnormal uterine and vaginal bleeding, unspecified: Secondary | ICD-10-CM

## 2014-09-18 MED ORDER — DOXYCYCLINE HYCLATE 50 MG PO CAPS: 100.0000 mg | ORAL_CAPSULE | Freq: Two times a day (BID) | ORAL | Status: DC

## 2014-09-18 MED ORDER — MEDROXYPROGESTERONE ACETATE 150 MG/ML IM SUSP: 150.0000 mg | INTRAMUSCULAR | Status: DC

## 2014-09-20 NOTE — Progress Notes (Signed)
Patient ID: Jillian [name], female   DOB: [birth year], 18 y.o.   MRN: [id number]  Chief Complaint  Patient presents with  . Gynecologic Exam    follow up- bleeding on Nexplanon    HPI Jillian [name] is a 18 y.o. female.   HPI  Past Medical History  Diagnosis Date  . Hypoglycemia   . Anemia   . Anxiety   . Depression   . OCD (obsessive compulsive disorder)     Past Surgical History  Procedure Laterality Date  . Dental surgery    . Wisdom tooth extraction Bilateral     Family History  Problem Relation Age of Onset  . Cancer      unknown  . Diabetes Mother   . Diabetes Paternal Grandmother   . Hypertension Paternal Grandfather   . Heart disease Maternal Grandfather   . Healthy Sister     x 3 half  . Glaucoma Father   . Eczema Sister     Social History History  Substance Use Topics  . Smoking status: Never Smoker   . Smokeless tobacco: Never Used  . Alcohol Use: No    Allergies  Allergen Reactions  . Chicken Allergy Nausea And Vomiting  . Shrimp [Shellfish Allergy] Nausea And Vomiting    Current Outpatient Prescriptions  Medication Sig Dispense Refill  . ARIPiprazole (ABILIFY) 2 MG tablet Take 1 tablet (2 mg total) by mouth daily. 30 tablet 0  . doxycycline (VIBRAMYCIN) 50 MG capsule Take 2 capsules (100 mg total) by mouth 2 (two) times daily. 40 capsule 0  . etonogestrel (NEXPLANON) 68 MG IMPL implant 1 each by Subdermal route once.    . hydrOXYzine (ATARAX/VISTARIL) 25 MG tablet Take 1 tablet (25 mg total) by mouth every 6 (six) hours as needed for anxiety. (Patient not taking: Reported on 09/18/2014) 30 tablet 0  . medroxyPROGESTERone (DEPO-PROVERA) 150 MG/ML injection Inject 1 mL (150 mg total) into the muscle every 3 (three) months. 1 mL 3  . Mefenamic Acid 250 MG CAPS Take 2 capsules by mouth 3 (three) times daily as needed (For menstrual pain.).    Marland Kitchen nicotine polacrilex (NICORETTE) 2 MG gum Take 1 each (2 mg total) by mouth as needed for smoking cessation.  (Patient not taking: Reported on 09/18/2014) 100 tablet 0  . Olopatadine HCl (PATADAY) 0.2 % SOLN Place 2 drops into both eyes daily as needed (For allergies.).    Marland Kitchen senna (SENOKOT) 8.6 MG tablet Take 1 tablet by mouth daily as needed for constipation.     . sertraline (ZOLOFT) 100 MG tablet Take 1 tablet (100 mg total) by mouth 2 (two) times daily. 60 tablet 0  . traZODone (DESYREL) 100 MG tablet Take 1 tablet (100 mg total) by mouth at bedtime as needed for sleep. 30 tablet 0   No current facility-administered medications for this visit.    Review of Systems Review of Systems Constitutional: negative for fatigue and weight loss Respiratory: negative for cough and wheezing Cardiovascular: negative for chest pain, fatigue and palpitations Gastrointestinal: negative for abdominal pain and change in bowel habits Genitourinary:positive for abnormal uterine bleeding Integument/breast: negative for nipple discharge Musculoskeletal:negative for myalgias Neurological: negative for gait problems and tremors Behavioral/Psych: negative for abusive relationship, depression Endocrine: negative for temperature intolerance     Blood pressure 100/61, pulse 87, temperature 98.7 F (37.1 C), weight 56.7 kg (125 lb), last menstrual period 08/25/2014.  Physical Exam Physical Exam   50% of 15 min visit spent on counseling  and coordination of care.   Data Reviewed UPT  Assessment    Unscheduled bleeding with Nexplanon    Plan  Meds ordered this encounter  Medications  . medroxyPROGESTERone (DEPO-PROVERA) 150 MG/ML injection    Sig: Inject 1 mL (150 mg total) into the muscle every 3 (three) months.    Dispense:  1 mL    Refill:  3  . doxycycline (VIBRAMYCIN) 50 MG capsule    Sig: Take 2 capsules (100 mg total) by mouth 2 (two) times daily.    Dispense:  40 capsule    Refill:  0    Possible management options include: return for Nexplanon removal Follow up as needed.          JACKSON-MOORE,Jolee Critcher A 09/20/2014, 3:14 PM

## 2014-09-20 NOTE — Patient Instructions (Signed)

## 2014-09-23 ENCOUNTER — Encounter: Payer: Self-pay | Admitting: *Deleted

## 2014-09-24 ENCOUNTER — Encounter: Payer: Self-pay | Admitting: Obstetrics & Gynecology

## 2014-09-26 ENCOUNTER — Ambulatory Visit (INDEPENDENT_AMBULATORY_CARE_PROVIDER_SITE_OTHER): Admitting: Obstetrics & Gynecology

## 2014-09-26 VITALS — BP 110/63 | HR 96 | Temp 98.3°F | Wt 127.0 lb

## 2014-09-26 DIAGNOSIS — Z3046 Encounter for surveillance of implantable subdermal contraceptive: Secondary | ICD-10-CM

## 2014-09-26 DIAGNOSIS — Z3042 Encounter for surveillance of injectable contraceptive: Secondary | ICD-10-CM

## 2014-09-26 DIAGNOSIS — Z3049 Encounter for surveillance of other contraceptives: Secondary | ICD-10-CM

## 2014-09-26 MED ORDER — MEDROXYPROGESTERONE ACETATE 150 MG/ML IM SUSP
150.0000 mg | INTRAMUSCULAR | Status: AC
  Administered 2014-09-26 – 2015-09-15 (×4): 150 mg via INTRAMUSCULAR

## 2014-10-03 NOTE — Progress Notes (Signed)
   BP 110/63 mmHg  Pulse 96  Temp(Src) 98.3 F (36.8 C)  Wt 57.607 kg (127 lb)  LMP 08/25/2014   NEXPLANON REMOVAL   Reasons  for removal:  Pt request/unscheduled bleeding   A timeout was performed confirming the patient, the procedure and allergy status. The patient's left  arm was palpated and the implant device located. The area was prepped with Hibiclens-Alcoholx3. The distal end of the device was palpated and 2 cc of 1% lidocaine was injected. A 5 mm incision was made. Any fibrotic tissue was carefully dissected away using blunt and/or sharp dissection. The device was removed in an intact manner. Steri-strips and a sterile dressing were applied to the incision. The patient tolerated the procedure well.  The patient is to start Depo Provera F/U prn

## 2014-10-04 ENCOUNTER — Other Ambulatory Visit: Payer: Self-pay | Admitting: Obstetrics & Gynecology

## 2014-10-10 ENCOUNTER — Other Ambulatory Visit: Payer: Self-pay | Admitting: Obstetrics

## 2014-10-10 DIAGNOSIS — N946 Dysmenorrhea, unspecified: Secondary | ICD-10-CM

## 2014-10-11 NOTE — Telephone Encounter (Signed)
Please advise 

## 2014-12-18 ENCOUNTER — Ambulatory Visit (INDEPENDENT_AMBULATORY_CARE_PROVIDER_SITE_OTHER): Admitting: *Deleted

## 2014-12-18 VITALS — BP 107/68 | HR 83 | Temp 98.2°F | Ht 64.0 in | Wt 128.0 lb

## 2014-12-18 DIAGNOSIS — Z3042 Encounter for surveillance of injectable contraceptive: Secondary | ICD-10-CM

## 2014-12-18 NOTE — Progress Notes (Signed)
Patient in office for a Depo Injection. Patient is on time for her injection. Patient tolerated injection well.  Patient due for next injection March 11, 2015.  BP 107/68 mmHg  Pulse 83  Temp(Src) 98.2 F (36.8 C)  Ht 5\' 4"  (1.626 m)  Wt 128 lb (58.06 kg)  BMI 21.96 kg/m2  LMP 12/16/2014   Administrations This Visit    medroxyPROGESTERone (DEPO-PROVERA) injection 150 mg    Admin Date Action Dose Route Administered By         12/18/2014 Given 150 mg Intramuscular Carole Binning, LPN

## 2015-02-27 ENCOUNTER — Other Ambulatory Visit: Payer: Self-pay | Admitting: Obstetrics

## 2015-03-11 ENCOUNTER — Ambulatory Visit (INDEPENDENT_AMBULATORY_CARE_PROVIDER_SITE_OTHER): Admitting: *Deleted

## 2015-03-11 ENCOUNTER — Ambulatory Visit

## 2015-03-11 VITALS — BP 108/66 | HR 90 | Temp 98.6°F | Wt 133.0 lb

## 2015-03-11 DIAGNOSIS — Z3042 Encounter for surveillance of injectable contraceptive: Secondary | ICD-10-CM | POA: Diagnosis not present

## 2015-03-11 NOTE — Progress Notes (Signed)
Pt is in office today for depo injection.  Pt is on time for her injection.  Injection given, pt tolerated well.  Pt was advised to RTO on June 02, 2015 for next injection.  Pt, along with her mother, state that pt cycles have been irregular over the last several months.  Pt states that her cycles are lighter and less cramping than before.  Pt states that she has been having irregular bleeding though.  Pt, and mother, were advised to continue to track bleeding after this dose of depo.  Pt made aware it may take several injections before bleeding will regulate or have no cycle at all.  Pt was made aware to contact office if irregular bleeding continues.  Pt will be leaving in the fall for school out of town.  Pt advised to follow up in office for bleeding concerns before leaving for school if needed. Pt states understanding.  BP 108/66 mmHg  Pulse 90  Temp(Src) 98.6 F (37 C)  Wt 133 lb (60.328 kg)  Administrations This Visit    medroxyPROGESTERone (DEPO-PROVERA) injection 150 mg    Admin Date Action Dose Route Administered By         03/11/2015 Given 150 mg Intramuscular Valene Bors, CMA

## 2015-06-03 ENCOUNTER — Ambulatory Visit

## 2015-09-15 ENCOUNTER — Other Ambulatory Visit (INDEPENDENT_AMBULATORY_CARE_PROVIDER_SITE_OTHER): Admitting: *Deleted

## 2015-09-15 VITALS — BP 107/69 | HR 78 | Wt 144.0 lb

## 2015-09-15 DIAGNOSIS — Z3042 Encounter for surveillance of injectable contraceptive: Secondary | ICD-10-CM

## 2015-09-15 NOTE — Progress Notes (Signed)
Pt is in office today for UPT and depo injection.  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  Pt is currently late on this injection.  UPT in office today is Negative. Pt states that she is a virgin and has never had sexual intercourse.  Pt was made aware injection today would be reviewed with Dr Jodi Mourning.  Reviewed with provider and injection was approved to be given today.  Pt was made aware that her next injection will be due December 07, 2015. Pt advised that she may get injection at school if she is not in town. Pt was advised to sent office a copy of injection if that is the case.  If pt is able to be home on break she will make appt to come to office for injection.  Pt was also made aware that she may need to schedule a Routine exam as well. Pt states understanding.  BP 107/69 mmHg  Pulse 78  Wt 144 lb (65.318 kg)  Administrations This Visit    medroxyPROGESTERone (DEPO-PROVERA) injection 150 mg    Admin Date Action Dose Route Administered By         09/15/2015 Given 150 mg Intramuscular Valene Bors, CMA

## 2015-12-02 ENCOUNTER — Ambulatory Visit (INDEPENDENT_AMBULATORY_CARE_PROVIDER_SITE_OTHER): Admitting: *Deleted

## 2015-12-02 VITALS — BP 105/65 | HR 88 | Temp 98.7°F | Wt 143.0 lb

## 2015-12-02 DIAGNOSIS — Z3042 Encounter for surveillance of injectable contraceptive: Secondary | ICD-10-CM

## 2015-12-02 MED ORDER — MEDROXYPROGESTERONE ACETATE 150 MG/ML IM SUSP
150.0000 mg | INTRAMUSCULAR | Status: AC
  Administered 2015-12-02: 150 mg via INTRAMUSCULAR

## 2015-12-02 NOTE — Progress Notes (Signed)
Patient in office for a Depo injection. Patient is on time for her injection.  Patient tolerated injection well. Patient due for next injection Feb 23, 2016.  BP 105/65 mmHg  Pulse 88  Temp(Src) 98.7 F (37.1 C)  Wt 143 lb (64.864 kg)   Administrations This Visit    medroxyPROGESTERone (DEPO-PROVERA) injection 150 mg    Admin Date Action Dose Route Administered By         12/02/2015 Given 150 mg Intramuscular Carole Binning, LPN

## 2015-12-08 ENCOUNTER — Ambulatory Visit

## 2015-12-22 ENCOUNTER — Other Ambulatory Visit: Payer: Self-pay | Admitting: Obstetrics

## 2016-01-19 ENCOUNTER — Telehealth: Payer: Self-pay | Admitting: *Deleted

## 2016-01-19 NOTE — Telephone Encounter (Signed)
Patient's mother is calling for her- she is going to be studying abroad in [place] and she can not continue her depo provera. She needs to control her cycles and she has failed the Nexplanon. Patient has never been sexually active- but wants the IUD. Can she get it and will it control her cycles? She is free to get it 5/16-5/19.

## 2016-01-20 NOTE — Telephone Encounter (Signed)
Recommend OCP.

## 2016-01-20 NOTE — Telephone Encounter (Signed)
Patient has been seeing [place] since she was 13 for irregular bleeding- she has tried Lo [employer], LoSeasonique, Seasonique/ Amethia in the past without success. I don't know if we can get her to try the pills again.

## 2016-02-06 ENCOUNTER — Other Ambulatory Visit: Payer: Self-pay | Admitting: *Deleted

## 2016-02-06 MED ORDER — CYCLOBENZAPRINE HCL 10 MG PO TABS: 10.0000 mg | ORAL_TABLET | Freq: Three times a day (TID) | ORAL | Status: DC | PRN

## 2016-02-06 MED ORDER — MISOPROSTOL 200 MCG PO TABS: ORAL_TABLET | ORAL | Status: DC

## 2016-02-06 MED ORDER — IBUPROFEN 800 MG PO TABS: 800.0000 mg | ORAL_TABLET | Freq: Three times a day (TID) | ORAL | Status: DC | PRN

## 2016-02-06 NOTE — Progress Notes (Signed)
Rx have been sent to pharmacy for pre procedure treatment. Pt is scheduled for IUD placement on Wednesday. LM on VM that Rx were sent to [facility]. Advised to call office Monday if any problems with Rx.

## 2016-02-09 ENCOUNTER — Ambulatory Visit

## 2016-02-11 ENCOUNTER — Ambulatory Visit: Admitting: Certified Nurse Midwife

## 2016-02-12 ENCOUNTER — Ambulatory Visit (INDEPENDENT_AMBULATORY_CARE_PROVIDER_SITE_OTHER): Admitting: Certified Nurse Midwife

## 2016-02-12 ENCOUNTER — Encounter: Payer: Self-pay | Admitting: Certified Nurse Midwife

## 2016-02-12 VITALS — BP 103/62 | HR 78 | Temp 99.0°F | Wt 149.0 lb

## 2016-02-12 DIAGNOSIS — N939 Abnormal uterine and vaginal bleeding, unspecified: Secondary | ICD-10-CM | POA: Insufficient documentation

## 2016-02-12 DIAGNOSIS — Z30430 Encounter for insertion of intrauterine contraceptive device: Secondary | ICD-10-CM | POA: Diagnosis not present

## 2016-02-12 DIAGNOSIS — Z01419 Encounter for gynecological examination (general) (routine) without abnormal findings: Secondary | ICD-10-CM

## 2016-02-12 NOTE — Progress Notes (Signed)
Patient ID: EDYNN KINLOCH, female   DOB: Jan 30, 1996, 20 y.o.   MRN: [id number]  IUD Procedure Note   DIAGNOSIS: Desires long-term, reversible contraception   PROCEDURE: IUD placement Performing Provider: Kandis Cocking CNM  Patient counseled prior to procedure. I explained risks and benefits of Lyletta IUD, reviewed alternative forms of contraception. Patient stated understanding and consented to continue with procedure. Virginal, given flexeril, motrin and Cytotec prior to procedure.   LMP: 02/11/16 Pregnancy Test: Negative Lot #: [id number] Expiration Date: 12/2018   IUD type: [   ] Mirena   [   ] Paraguard  [   ] Isla Pence   [   ]  Kyleena  [X]   Lyletta  PROCEDURE:  Timeout procedure was performed to ensure right patient and right site.  A bimanual exam was performed to determine the position of the uterus, retroverted. The speculum was placed. The vagina and cervix was sterilized in the usual manner and sterile technique was maintained throughout the course of the procedure. A single toothed tenaculum was not required. The depth of the uterus was sounded to 9 cm. The IUD was inserted to the appropriate depth and inserted without difficulty.  The string was cut to an estimated 4 cm length. Bleeding was minimal. The patient tolerated the procedure well.   Follow up: The patient tolerated the procedure well without complications.  Standard post-procedure care is explained and return precautions are given.  Kandis Cocking CNM

## 2016-02-12 NOTE — Addendum Note (Signed)
Addended by: Lewie Loron D on: 02/12/2016 02:01 PM   Modules accepted: Orders

## 2016-02-16 ENCOUNTER — Telehealth: Payer: Self-pay | Admitting: *Deleted

## 2016-02-16 LAB — NUSWAB VG, CANDIDA 6SP
CANDIDA KRUSEI, NAA: NEGATIVE
CANDIDA PARAPSILOSIS, NAA: NEGATIVE
CANDIDA TROPICALIS, NAA: NEGATIVE
Candida albicans, NAA: NEGATIVE
Candida glabrata, NAA: NEGATIVE
Candida lusitaniae, NAA: NEGATIVE
Trich vag by NAA: NEGATIVE

## 2016-02-16 LAB — PAP IG W/ RFLX HPV ASCU: PAP SMEAR COMMENT: 0

## 2016-02-16 NOTE — Telephone Encounter (Signed)
Follow up call to patient- she had called the on call service regarding her IUD over the week end.  Patient's mother states she feels her strings when she wipes- suspicious for expulsion. Call to patient- she is in meeting with her advisor- I will call her back in an hour.

## 2016-02-17 ENCOUNTER — Other Ambulatory Visit: Payer: Self-pay | Admitting: *Deleted

## 2016-02-17 DIAGNOSIS — Z30431 Encounter for routine checking of intrauterine contraceptive device: Secondary | ICD-10-CM

## 2016-02-17 NOTE — Telephone Encounter (Signed)
Spoke with patient - she feels pressure. The string has migrated back up into the vagina. Patient checked for the string and to feel to see if she felt the hub- nothing hard felt- patient is reassured. Patient to keep her appointment for recheck next week she will have an [place] for placement at that time. Patient instructed to go to her local ED if she has any changes over the week end. Order for [place] placed.

## 2016-02-24 ENCOUNTER — Ambulatory Visit

## 2016-02-27 ENCOUNTER — Encounter: Payer: Self-pay | Admitting: Certified Nurse Midwife

## 2016-02-27 ENCOUNTER — Ambulatory Visit (HOSPITAL_COMMUNITY)
Admission: RE | Admit: 2016-02-27 | Discharge: 2016-02-27 | Disposition: A | Discharge status: Home / Self Care | Source: Ambulatory Visit | Attending: Certified Nurse Midwife | Admitting: Certified Nurse Midwife

## 2016-02-27 ENCOUNTER — Ambulatory Visit (INDEPENDENT_AMBULATORY_CARE_PROVIDER_SITE_OTHER): Admitting: Certified Nurse Midwife

## 2016-02-27 VITALS — BP 120/70 | HR 95 | Wt 154.0 lb

## 2016-02-27 DIAGNOSIS — Z30431 Encounter for routine checking of intrauterine contraceptive device: Secondary | ICD-10-CM | POA: Insufficient documentation

## 2016-02-27 NOTE — Progress Notes (Signed)
Patient ID: Jillian [name], female   DOB: Feb 24, 1996, 20 y.o.   MRN: [id number]  Chief Complaint  Patient presents with  . Follow-up    HPI Jillian [name] is a 20 y.o. female.  Here for f/u on IUD.  States that sometimes she can feel her strings poking her and when she wipes, denies trying to pull out the IUD.  Had [place] earlier this AM.  Amenorrhea since insertion.  Otherwise is doing well.      HPI  Past Medical History  Diagnosis Date  . Hypoglycemia   . Anemia   . Anxiety   . Depression   . OCD (obsessive compulsive disorder)     Past Surgical History  Procedure Laterality Date  . Dental surgery    . Wisdom tooth extraction Bilateral     Family History  Problem Relation Age of Onset  . Cancer      unknown  . Diabetes Mother   . Diabetes Paternal Grandmother   . Hypertension Paternal Grandfather   . Heart disease Maternal Grandfather   . Healthy Sister     x 3 half  . Glaucoma Father   . Eczema Sister     Social History Social History  Substance Use Topics  . Smoking status: Never Smoker   . Smokeless tobacco: Never Used  . Alcohol Use: No    Allergies  Allergen Reactions  . Chicken Allergy Nausea And Vomiting  . Shrimp [Shellfish Allergy] Nausea And Vomiting    Current Outpatient Prescriptions  Medication Sig Dispense Refill  . ARIPiprazole (ABILIFY) 2 MG tablet Take 1 tablet (2 mg total) by mouth daily. 30 tablet 0  . cyclobenzaprine (FLEXERIL) 10 MG tablet Take 1 tablet (10 mg total) by mouth every 8 (eight) hours as needed for muscle spasms. 15 tablet 0  . ibuprofen (ADVIL,MOTRIN) 800 MG tablet Take 1 tablet (800 mg total) by mouth every 8 (eight) hours as needed. 60 tablet 1  . Mefenamic Acid 250 MG CAPS TAKE 2 CAPSULES THREE TIMES A DAY 28 capsule 4  . Olopatadine HCl (PATADAY) 0.2 % SOLN Place 2 drops into both eyes daily as needed (For allergies.).    Marland Kitchen sertraline (ZOLOFT) 100 MG tablet Take 1 tablet (100 mg total) by mouth 2 (two) times daily. 60 tablet  0  . traZODone (DESYREL) 100 MG tablet Take 1 tablet (100 mg total) by mouth at bedtime as needed for sleep. 30 tablet 0   Current Facility-Administered Medications  Medication Dose Route Frequency Provider Last Rate Last Dose  . medroxyPROGESTERone (DEPO-PROVERA) injection 150 mg  150 mg Intramuscular Q90 days Morene Crocker, CNM   150 mg at 12/02/15 [id number]    Review of Systems Review of Systems Constitutional: negative for fatigue and weight loss Respiratory: negative for cough and wheezing Cardiovascular: negative for chest pain, fatigue and palpitations Gastrointestinal: negative for abdominal pain and change in bowel habits Genitourinary:negative Integument/breast: negative for nipple discharge Musculoskeletal:negative for myalgias Neurological: negative for gait problems and tremors Behavioral/Psych: negative for abusive relationship, depression Endocrine: negative for temperature intolerance     Blood pressure 120/70, pulse 95, weight 154 lb (69.854 kg).  Physical Exam Physical Exam General:   alert  Skin:   no rash or abnormalities  Lungs:   clear to auscultation bilaterally  Heart:   regular rate and rhythm, S1, S2 normal, no murmur, click, rub or gallop  Breasts:   deferred  Abdomen:  normal findings: no organomegaly, soft, non-tender and no  hernia  Pelvis:  External genitalia: normal general appearance Urinary system: urethral meatus normal and bladder without fullness, nontender Vaginal: normal without tenderness, induration or masses Cervix: normal appearance, IUD strings present, IUD strings trimmed per patient request 2 cm shorter.   Adnexa: normal bimanual exam Uterus: anteverted and non-tender, normal size   PUS:  IUD in fundus, left arm maybe embedded in myometrium.     50% of 15 min visit spent on counseling and coordination of care.   Data Reviewed Previous medical hx, meds, labs, [place]  Assessment     IUD check up, in place.      Plan    No orders  of the defined types were placed in this encounter.   No orders of the defined types were placed in this encounter.     Possible management options include: IUD removal and reinsertion.  Follow up as needed.

## 2016-03-26 ENCOUNTER — Ambulatory Visit: Payer: Self-pay | Admitting: Certified Nurse Midwife

## 2016-05-28 ENCOUNTER — Other Ambulatory Visit: Payer: Self-pay | Admitting: Obstetrics

## 2016-12-02 ENCOUNTER — Other Ambulatory Visit: Payer: Self-pay | Admitting: Obstetrics

## 2018-06-02 DIAGNOSIS — F3132 Bipolar disorder, current episode depressed, moderate: Secondary | ICD-10-CM | POA: Diagnosis not present

## 2018-06-09 DIAGNOSIS — F3132 Bipolar disorder, current episode depressed, moderate: Secondary | ICD-10-CM | POA: Diagnosis not present

## 2018-06-16 DIAGNOSIS — F3132 Bipolar disorder, current episode depressed, moderate: Secondary | ICD-10-CM | POA: Diagnosis not present

## 2018-06-23 DIAGNOSIS — F3132 Bipolar disorder, current episode depressed, moderate: Secondary | ICD-10-CM | POA: Diagnosis not present

## 2018-06-30 DIAGNOSIS — F3132 Bipolar disorder, current episode depressed, moderate: Secondary | ICD-10-CM | POA: Diagnosis not present

## 2018-07-07 DIAGNOSIS — F3132 Bipolar disorder, current episode depressed, moderate: Secondary | ICD-10-CM | POA: Diagnosis not present

## 2018-07-28 DIAGNOSIS — F3132 Bipolar disorder, current episode depressed, moderate: Secondary | ICD-10-CM | POA: Diagnosis not present

## 2018-08-04 DIAGNOSIS — F3132 Bipolar disorder, current episode depressed, moderate: Secondary | ICD-10-CM | POA: Diagnosis not present

## 2018-08-09 DIAGNOSIS — Z0000 Encounter for general adult medical examination without abnormal findings: Secondary | ICD-10-CM | POA: Diagnosis not present

## 2018-08-09 DIAGNOSIS — Z23 Encounter for immunization: Secondary | ICD-10-CM | POA: Diagnosis not present

## 2018-08-10 DIAGNOSIS — Z131 Encounter for screening for diabetes mellitus: Secondary | ICD-10-CM | POA: Diagnosis not present

## 2018-08-10 DIAGNOSIS — Z13220 Encounter for screening for lipoid disorders: Secondary | ICD-10-CM | POA: Diagnosis not present

## 2018-08-11 DIAGNOSIS — F3132 Bipolar disorder, current episode depressed, moderate: Secondary | ICD-10-CM | POA: Diagnosis not present

## 2018-08-18 DIAGNOSIS — F3132 Bipolar disorder, current episode depressed, moderate: Secondary | ICD-10-CM | POA: Diagnosis not present

## 2018-09-01 DIAGNOSIS — F3132 Bipolar disorder, current episode depressed, moderate: Secondary | ICD-10-CM | POA: Diagnosis not present

## 2018-09-06 DIAGNOSIS — F429 Obsessive-compulsive disorder, unspecified: Secondary | ICD-10-CM | POA: Diagnosis not present

## 2018-09-06 DIAGNOSIS — F251 Schizoaffective disorder, depressive type: Secondary | ICD-10-CM | POA: Diagnosis not present

## 2018-09-13 ENCOUNTER — Encounter: Payer: Self-pay | Admitting: Certified Nurse Midwife

## 2018-09-13 ENCOUNTER — Ambulatory Visit (INDEPENDENT_AMBULATORY_CARE_PROVIDER_SITE_OTHER): Payer: BLUE CROSS/BLUE SHIELD | Admitting: Certified Nurse Midwife

## 2018-09-13 VITALS — BP 132/76 | HR 98 | Ht 64.0 in | Wt 211.0 lb

## 2018-09-13 DIAGNOSIS — Z113 Encounter for screening for infections with a predominantly sexual mode of transmission: Secondary | ICD-10-CM | POA: Diagnosis not present

## 2018-09-13 DIAGNOSIS — Z01419 Encounter for gynecological examination (general) (routine) without abnormal findings: Secondary | ICD-10-CM

## 2018-09-13 DIAGNOSIS — Z124 Encounter for screening for malignant neoplasm of cervix: Secondary | ICD-10-CM

## 2018-09-13 DIAGNOSIS — Z975 Presence of (intrauterine) contraceptive device: Secondary | ICD-10-CM

## 2018-09-13 NOTE — Progress Notes (Signed)
GYNECOLOGY ANNUAL PREVENTATIVE CARE ENCOUNTER NOTE  Subjective:   Jillian [name] is a 22 y.o. G0P0000 female here for a routine annual gynecologic exam.  Current complaints: IUD strings poking side of vaginal wall. Denies abnormal vaginal bleeding, discharge, pelvic pain,  or other gynecologic concerns.    Gynecologic History No LMP recorded. Patient has had an IUD- Liletta  Contraception: IUD Last Pap: 2017. Results were: normal with negative HPV  Obstetric History OB History  Gravida Para Term Preterm AB Living  0 0 0 0 0 0  SAB TAB Ectopic Multiple Live Births  0 0 0 0      Past Medical History:  Diagnosis Date  . Anemia   . Anxiety   . Depression   . Hypoglycemia   . OCD (obsessive compulsive disorder)     Past Surgical History:  Procedure Laterality Date  . DENTAL SURGERY    . WISDOM TOOTH EXTRACTION Bilateral     Current Outpatient Medications on File Prior to Visit  Medication Sig Dispense Refill  . ARIPiprazole (ABILIFY) 2 MG tablet Take 1 tablet (2 mg total) by mouth daily. (Patient taking differently: Take 2.5 mg by mouth daily. ) 30 tablet 0  . ibuprofen (ADVIL,MOTRIN) 800 MG tablet Take 1 tablet (800 mg total) by mouth every 8 (eight) hours as needed. 60 tablet 1  . Olopatadine HCl (PATADAY) 0.2 % SOLN Place 2 drops into both eyes daily as needed (For allergies.).    Marland Kitchen traZODone (DESYREL) 100 MG tablet Take 1 tablet (100 mg total) by mouth at bedtime as needed for sleep. 30 tablet 0  . venlafaxine (EFFEXOR) 100 MG tablet Take 300 mg by mouth 2 (two) times daily.     No current facility-administered medications on file prior to visit.     Allergies  Allergen Reactions  . Chicken Allergy Nausea And Vomiting  . Latex     Social History:  reports that she has never smoked. She has never used smokeless tobacco. She reports that she does not drink alcohol or use drugs.  Family History  Problem Relation Age of Onset  . Diabetes Mother   . Glaucoma Father    . Cancer Other        unknown  . Diabetes Paternal Grandmother   . Hypertension Paternal Grandfather   . Heart disease Maternal Grandfather   . Healthy Sister        x 3 half  . Eczema Sister     The following portions of the patient's history were reviewed and updated as appropriate: allergies, current medications, past family history, past medical history, past social history, past surgical history and problem list.  Review of Systems Pertinent items noted in HPI and remainder of comprehensive ROS otherwise negative.   Objective:  BP 132/76   Pulse 98   Ht 5\' 4"  (1.626 m)   Wt 211 lb (95.7 kg)   BMI 36.22 kg/m  CONSTITUTIONAL: Well-developed, well-nourished female in no acute distress.  HENT:  Normocephalic, atraumatic, External right and left ear normal. Oropharynx is clear and moist EYES: Conjunctivae and EOM are normal. Pupils are equal, round, and reactive to light. SKIN: Skin is warm and dry. No rash noted. Not diaphoretic. No erythema. No pallor. MUSCULOSKELETAL: Normal range of motion. No tenderness.  No cyanosis, clubbing, or edema.  2+ distal pulses. NEUROLOGIC: Alert and oriented to person, place, and time. Normal reflexes, muscle tone coordination. No cranial nerve deficit noted. PSYCHIATRIC: Normal mood and affect. Normal  behavior. Normal judgment and thought content. CARDIOVASCULAR: Normal heart rate noted, regular rhythm RESPIRATORY: Clear to auscultation bilaterally. Effort and breath sounds normal, no problems with respiration noted. BREASTS: Symmetric in size. No masses, skin changes, nipple drainage, or lymphadenopathy. ABDOMEN: Soft, normal bowel sounds, no distention noted.  No tenderness, rebound or guarding.  PELVIC: Normal appearing external genitalia; normal appearing vaginal mucosa and cervix.  No abnormal discharge noted.  Pap smear obtained- cervix friable with manipulation.  Normal uterine size, no other palpable masses, no uterine or adnexal  tenderness.  Assessment and Plan:  1. Encounter for annual routine gynecological examination - Normal well woman examination  - Cytology - PAP( [facility])  2. IUD (intrauterine device) in place - Stings cut shorter per patient request, tucked behind cervix  - Cervix friable with manipulation   Will follow up results of pap smear and manage accordingly. Mammogram scheduled Routine preventative health maintenance measures emphasized. Please refer to After Visit Summary for other counseling recommendations.    Lajean Manes, [place] for [employer], [place]

## 2018-09-15 DIAGNOSIS — F3132 Bipolar disorder, current episode depressed, moderate: Secondary | ICD-10-CM | POA: Diagnosis not present

## 2018-09-15 LAB — CYTOLOGY - PAP
Chlamydia: NEGATIVE
Diagnosis: NEGATIVE
Neisseria Gonorrhea: NEGATIVE
Trichomonas: NEGATIVE

## 2018-09-22 DIAGNOSIS — F3132 Bipolar disorder, current episode depressed, moderate: Secondary | ICD-10-CM | POA: Diagnosis not present

## 2018-09-29 DIAGNOSIS — F3132 Bipolar disorder, current episode depressed, moderate: Secondary | ICD-10-CM | POA: Diagnosis not present

## 2018-10-13 DIAGNOSIS — F3132 Bipolar disorder, current episode depressed, moderate: Secondary | ICD-10-CM | POA: Diagnosis not present

## 2018-10-20 DIAGNOSIS — F3132 Bipolar disorder, current episode depressed, moderate: Secondary | ICD-10-CM | POA: Diagnosis not present

## 2018-10-25 DIAGNOSIS — R0982 Postnasal drip: Secondary | ICD-10-CM | POA: Diagnosis not present

## 2018-10-25 DIAGNOSIS — J019 Acute sinusitis, unspecified: Secondary | ICD-10-CM | POA: Diagnosis not present

## 2018-10-27 DIAGNOSIS — F3132 Bipolar disorder, current episode depressed, moderate: Secondary | ICD-10-CM | POA: Diagnosis not present

## 2018-11-03 DIAGNOSIS — F3132 Bipolar disorder, current episode depressed, moderate: Secondary | ICD-10-CM | POA: Diagnosis not present

## 2018-11-24 DIAGNOSIS — F3132 Bipolar disorder, current episode depressed, moderate: Secondary | ICD-10-CM | POA: Diagnosis not present

## 2018-12-01 DIAGNOSIS — F3132 Bipolar disorder, current episode depressed, moderate: Secondary | ICD-10-CM | POA: Diagnosis not present

## 2018-12-08 DIAGNOSIS — F3132 Bipolar disorder, current episode depressed, moderate: Secondary | ICD-10-CM | POA: Diagnosis not present

## 2018-12-13 DIAGNOSIS — H40013 Open angle with borderline findings, low risk, bilateral: Secondary | ICD-10-CM | POA: Diagnosis not present

## 2018-12-13 DIAGNOSIS — H5213 Myopia, bilateral: Secondary | ICD-10-CM | POA: Diagnosis not present

## 2018-12-13 DIAGNOSIS — R51 Headache: Secondary | ICD-10-CM | POA: Diagnosis not present

## 2018-12-15 DIAGNOSIS — F3132 Bipolar disorder, current episode depressed, moderate: Secondary | ICD-10-CM | POA: Diagnosis not present

## 2018-12-22 DIAGNOSIS — F3132 Bipolar disorder, current episode depressed, moderate: Secondary | ICD-10-CM | POA: Diagnosis not present

## 2018-12-29 DIAGNOSIS — F3132 Bipolar disorder, current episode depressed, moderate: Secondary | ICD-10-CM | POA: Diagnosis not present

## 2019-01-05 DIAGNOSIS — F3132 Bipolar disorder, current episode depressed, moderate: Secondary | ICD-10-CM | POA: Diagnosis not present

## 2019-01-12 DIAGNOSIS — F3132 Bipolar disorder, current episode depressed, moderate: Secondary | ICD-10-CM | POA: Diagnosis not present

## 2019-01-26 DIAGNOSIS — F3132 Bipolar disorder, current episode depressed, moderate: Secondary | ICD-10-CM | POA: Diagnosis not present

## 2019-02-02 DIAGNOSIS — F3132 Bipolar disorder, current episode depressed, moderate: Secondary | ICD-10-CM | POA: Diagnosis not present

## 2019-02-09 DIAGNOSIS — F3132 Bipolar disorder, current episode depressed, moderate: Secondary | ICD-10-CM | POA: Diagnosis not present

## 2019-02-16 DIAGNOSIS — F3132 Bipolar disorder, current episode depressed, moderate: Secondary | ICD-10-CM | POA: Diagnosis not present

## 2019-02-23 DIAGNOSIS — F3132 Bipolar disorder, current episode depressed, moderate: Secondary | ICD-10-CM | POA: Diagnosis not present

## 2019-03-07 DIAGNOSIS — R51 Headache: Secondary | ICD-10-CM | POA: Diagnosis not present

## 2019-03-09 DIAGNOSIS — F3132 Bipolar disorder, current episode depressed, moderate: Secondary | ICD-10-CM | POA: Diagnosis not present

## 2019-03-16 DIAGNOSIS — F3132 Bipolar disorder, current episode depressed, moderate: Secondary | ICD-10-CM | POA: Diagnosis not present

## 2019-03-23 DIAGNOSIS — F3132 Bipolar disorder, current episode depressed, moderate: Secondary | ICD-10-CM | POA: Diagnosis not present

## 2019-03-28 DIAGNOSIS — H538 Other visual disturbances: Secondary | ICD-10-CM | POA: Diagnosis not present

## 2019-03-28 DIAGNOSIS — R51 Headache: Secondary | ICD-10-CM | POA: Diagnosis not present

## 2019-03-30 DIAGNOSIS — F3132 Bipolar disorder, current episode depressed, moderate: Secondary | ICD-10-CM | POA: Diagnosis not present

## 2019-04-02 ENCOUNTER — Encounter: Payer: Self-pay | Admitting: Neurology

## 2019-04-02 DIAGNOSIS — F429 Obsessive-compulsive disorder, unspecified: Secondary | ICD-10-CM | POA: Diagnosis not present

## 2019-04-02 DIAGNOSIS — F251 Schizoaffective disorder, depressive type: Secondary | ICD-10-CM | POA: Diagnosis not present

## 2019-04-06 DIAGNOSIS — F3132 Bipolar disorder, current episode depressed, moderate: Secondary | ICD-10-CM | POA: Diagnosis not present

## 2019-04-12 NOTE — Progress Notes (Addendum)
Virtual Visit via Video Note The purpose of this virtual visit is to provide medical care while limiting exposure to the novel coronavirus.    Consent was obtained for video visit:  Yes.   Answered questions that patient had about telehealth interaction:  Yes.   I discussed the limitations, risks, security and privacy concerns of performing an evaluation and management service by telemedicine. I also discussed with the patient that there may be a patient responsible charge related to this service. The patient expressed understanding and agreed to proceed.  Pt location: Home Physician Location: office Name of referring provider:  Rankins, Bill Salinas, MD I connected with Jillian [name] at patients initiation/request on 04/13/2019 at  7:50 AM EDT by video enabled telemedicine application and verified that I am speaking with the correct person using two identifiers. Pt MRN:  [id number] Pt DOB:  07/22/1996 Video Participants:  Jillian [name]   History of Present Illness:  Jillian [name] is a 23 year old female who presents for blurred vision and headaches.    Last year, she has been having headaches, initially occurring twice a week.  She occasionally had mild headaches prior to that.  This year, they have become daily.  They are in back of her head but mostly on either side.  Often occur in evenings.  They are throbbing but can be dull constant.  No associated nausea, vomiting or obvious photophobia or phonophobia.  Usually the are 4/10.  When they are severe, they are up to a 9/10.  She has missed about 3 days of work to to severity.  She has 4 days of severe headache a month.  Rarely they are so severe that her vision blacks out for a bit.  It usually lasts an hour.  She sometimes notes a whooshing sound in her head.  She saw ophthalmologist Dr. Katy Fitch on 12/15/18 and he asked her if she gets headaches.  He reportedly saw "floaters" but upon questioning the patient, it appears that he didn't tell her she had  anything like papilledema.  No identifiable triggers.  She usually takes Tylenol, which helps.  No recent weight gain. Her mother has migraines.  Her father has glaucoma.   Current NSAIDs:  Ibuprofen (stopped taking it) Current analgesics:  Tylenol; Current antidepressant:  Effexor 300mg  twice daily Current sleep aid:  Trazodone Current birth control:  Levonorgestrel Other medication:  Abilify  Past Medical History: Past Medical History:  Diagnosis Date  . Anemia   . Anxiety   . Depression   . Hypoglycemia   . OCD (obsessive compulsive disorder)     Medications: Outpatient Encounter Medications as of 04/13/2019  Medication Sig  . ARIPiprazole (ABILIFY) 2 MG tablet Take 1 tablet (2 mg total) by mouth daily. (Patient taking differently: Take 2.5 mg by mouth daily. )  . ibuprofen (ADVIL,MOTRIN) 800 MG tablet Take 1 tablet (800 mg total) by mouth every 8 (eight) hours as needed.  . Olopatadine HCl (PATADAY) 0.2 % SOLN Place 2 drops into both eyes daily as needed (For allergies.).  Marland Kitchen traZODone (DESYREL) 100 MG tablet Take 1 tablet (100 mg total) by mouth at bedtime as needed for sleep.  Marland Kitchen venlafaxine (EFFEXOR) 100 MG tablet Take 300 mg by mouth 2 (two) times daily.   No facility-administered encounter medications on file as of 04/13/2019.     Allergies: Allergies  Allergen Reactions  . Chicken Allergy Nausea And Vomiting  . Latex     Family History: Family History  Problem Relation Age of Onset  . Diabetes Mother   . Glaucoma Father   . Cancer Other        unknown  . Diabetes Paternal Grandmother   . Hypertension Paternal Grandfather   . Heart disease Maternal Grandfather   . Healthy Sister        x 3 half  . Eczema Sister     Social History: Social History   Socioeconomic History  . Marital status: Single    Spouse name: Not on file  . Number of children: Not on file  . Years of education: Not on file  . Highest education level: Not on file  Occupational  History  . Not on file  Social Needs  . Financial resource strain: Not on file  . Food insecurity    Worry: Not on file    Inability: Not on file  . Transportation needs    Medical: Not on file    Non-medical: Not on file  Tobacco Use  . Smoking status: Never Smoker  . Smokeless tobacco: Never Used  Substance and Sexual Activity  . Alcohol use: No    Alcohol/week: 0.0 standard drinks  . Drug use: No  . Sexual activity: Never    Birth control/protection: I.U.D.  Lifestyle  . Physical activity    Days per week: Not on file    Minutes per session: Not on file  . Stress: Not on file  Relationships  . Social Herbalist on phone: Not on file    Gets together: Not on file    Attends religious service: Not on file    Active member of club or organization: Not on file    Attends meetings of clubs or organizations: Not on file    Relationship status: Not on file  . Intimate partner violence    Fear of current or ex partner: Not on file    Emotionally abused: Not on file    Physically abused: Not on file    Forced sexual activity: Not on file  Other Topics Concern  . Not on file  Social History Narrative  . Not on file    Observations/Objective:   Temperature (!) 97 F (36.1 C), temperature source Temporal, height 5\' 4"  (1.626 m), weight 211 lb (95.7 kg). No acute distress.  Alert and oriented.  Speech fluent and not dysarthric.  Language intact.  Eyes orthophoric on primary gaze.  Face symmetric.  Assessment and Plan:   Chronic daily headache Visual obscurations Pulsatile tinnitus  Primary concern is idiopathic intracranial hypertension.  1.  Will obtain those records from Dr. Katy Fitch in March.  Advised her to have a follow up exam with him, including visual field testing. 2.  MRI and MRV of head 3.  Following imaging, schedule for LP to measure opening pressure and assess CSF protein, glucose, cell count, gram stain and culture. 4.  Advised to discontinue  birth control if IIH identified. 5.  Continue headache diary 6.  Limit use of pain relievers to no more than 2 days out of week to prevent risk of rebound or medication-overuse headache. Follow up after testing.  Follow Up Instructions:    -I discussed the assessment and treatment plan with the patient. The patient was provided an opportunity to ask questions and all were answered. The patient agreed with the plan and demonstrated an understanding of the instructions.   The patient was advised to call back or seek an in-person evaluation if the  symptoms worsen or if the condition fails to improve as anticipated.    Total Time spent in visit with the patient was:  40 minutes   Dudley Major, DO

## 2019-04-13 ENCOUNTER — Telehealth: Payer: Self-pay

## 2019-04-13 ENCOUNTER — Other Ambulatory Visit: Payer: Self-pay

## 2019-04-13 ENCOUNTER — Encounter: Payer: Self-pay | Admitting: Neurology

## 2019-04-13 ENCOUNTER — Telehealth (INDEPENDENT_AMBULATORY_CARE_PROVIDER_SITE_OTHER): Payer: BC Managed Care – PPO | Admitting: Neurology

## 2019-04-13 VITALS — Temp 97.0°F | Ht 64.0 in | Wt 211.0 lb

## 2019-04-13 DIAGNOSIS — H543 Unqualified visual loss, both eyes: Secondary | ICD-10-CM

## 2019-04-13 DIAGNOSIS — R51 Headache: Secondary | ICD-10-CM

## 2019-04-13 DIAGNOSIS — F3132 Bipolar disorder, current episode depressed, moderate: Secondary | ICD-10-CM | POA: Diagnosis not present

## 2019-04-13 DIAGNOSIS — R519 Headache, unspecified: Secondary | ICD-10-CM

## 2019-04-13 DIAGNOSIS — H93A9 Pulsatile tinnitus, unspecified ear: Secondary | ICD-10-CM

## 2019-04-13 NOTE — Telephone Encounter (Signed)
Called [facility], spoke with Angelita Ingles to advise of LP and to schedule after MRI and MRV

## 2019-04-13 NOTE — Addendum Note (Signed)
Addended by: Clois Comber on: 04/13/2019 08:55 AM   Modules accepted: Orders

## 2019-04-20 DIAGNOSIS — F3132 Bipolar disorder, current episode depressed, moderate: Secondary | ICD-10-CM | POA: Diagnosis not present

## 2019-04-27 DIAGNOSIS — F3132 Bipolar disorder, current episode depressed, moderate: Secondary | ICD-10-CM | POA: Diagnosis not present

## 2019-05-02 ENCOUNTER — Ambulatory Visit
Admission: RE | Admit: 2019-05-02 | Discharge: 2019-05-02 | Disposition: A | Discharge status: Home / Self Care | Payer: BC Managed Care – PPO | Source: Ambulatory Visit | Attending: Neurology | Admitting: Neurology

## 2019-05-02 ENCOUNTER — Other Ambulatory Visit: Payer: Self-pay

## 2019-05-02 DIAGNOSIS — R519 Headache, unspecified: Secondary | ICD-10-CM

## 2019-05-02 DIAGNOSIS — H543 Unqualified visual loss, both eyes: Secondary | ICD-10-CM

## 2019-05-02 DIAGNOSIS — H93A9 Pulsatile tinnitus, unspecified ear: Secondary | ICD-10-CM

## 2019-05-02 DIAGNOSIS — R51 Headache: Secondary | ICD-10-CM | POA: Diagnosis not present

## 2019-05-03 ENCOUNTER — Telehealth: Payer: Self-pay

## 2019-05-03 NOTE — Telephone Encounter (Signed)
Pt sent results via My chart

## 2019-05-03 NOTE — Telephone Encounter (Signed)
-----   Message from Pieter Partridge, DO sent at 05/02/2019 12:00 PM EDT ----- MRI and MRV of head are unremarkable.

## 2019-05-04 ENCOUNTER — Other Ambulatory Visit: Payer: BC Managed Care – PPO

## 2019-05-04 DIAGNOSIS — F3132 Bipolar disorder, current episode depressed, moderate: Secondary | ICD-10-CM | POA: Diagnosis not present

## 2019-05-11 DIAGNOSIS — F3132 Bipolar disorder, current episode depressed, moderate: Secondary | ICD-10-CM | POA: Diagnosis not present

## 2019-05-14 DIAGNOSIS — T22191A Burn of first degree of multiple sites of right shoulder and upper limb, except wrist and hand, initial encounter: Secondary | ICD-10-CM | POA: Diagnosis not present

## 2019-05-14 DIAGNOSIS — T300 Burn of unspecified body region, unspecified degree: Secondary | ICD-10-CM | POA: Diagnosis not present

## 2019-05-14 DIAGNOSIS — T22292A Burn of second degree of multiple sites of left shoulder and upper limb, except wrist and hand, initial encounter: Secondary | ICD-10-CM | POA: Diagnosis not present

## 2019-05-15 DIAGNOSIS — T22092A Burn of unspecified degree of multiple sites of left shoulder and upper limb, except wrist and hand, initial encounter: Secondary | ICD-10-CM | POA: Diagnosis not present

## 2019-05-15 DIAGNOSIS — T22191A Burn of first degree of multiple sites of right shoulder and upper limb, except wrist and hand, initial encounter: Secondary | ICD-10-CM | POA: Diagnosis not present

## 2019-05-15 DIAGNOSIS — Y92511 Restaurant or cafe as the place of occurrence of the external cause: Secondary | ICD-10-CM | POA: Diagnosis not present

## 2019-05-15 DIAGNOSIS — X58XXXA Exposure to other specified factors, initial encounter: Secondary | ICD-10-CM | POA: Diagnosis not present

## 2019-05-15 DIAGNOSIS — T22292A Burn of second degree of multiple sites of left shoulder and upper limb, except wrist and hand, initial encounter: Secondary | ICD-10-CM | POA: Diagnosis not present

## 2019-05-15 DIAGNOSIS — Y939 Activity, unspecified: Secondary | ICD-10-CM | POA: Diagnosis not present

## 2019-05-15 DIAGNOSIS — T300 Burn of unspecified body region, unspecified degree: Secondary | ICD-10-CM | POA: Diagnosis not present

## 2019-05-16 DIAGNOSIS — L98492 Non-pressure chronic ulcer of skin of other sites with fat layer exposed: Secondary | ICD-10-CM | POA: Diagnosis not present

## 2019-05-17 DIAGNOSIS — F3132 Bipolar disorder, current episode depressed, moderate: Secondary | ICD-10-CM | POA: Diagnosis not present

## 2019-05-24 DIAGNOSIS — F3132 Bipolar disorder, current episode depressed, moderate: Secondary | ICD-10-CM | POA: Diagnosis not present

## 2019-05-29 DIAGNOSIS — T22191A Burn of first degree of multiple sites of right shoulder and upper limb, except wrist and hand, initial encounter: Secondary | ICD-10-CM | POA: Diagnosis not present

## 2019-05-29 DIAGNOSIS — T300 Burn of unspecified body region, unspecified degree: Secondary | ICD-10-CM | POA: Diagnosis not present

## 2019-05-29 DIAGNOSIS — T22292A Burn of second degree of multiple sites of left shoulder and upper limb, except wrist and hand, initial encounter: Secondary | ICD-10-CM | POA: Diagnosis not present

## 2019-05-31 DIAGNOSIS — F3132 Bipolar disorder, current episode depressed, moderate: Secondary | ICD-10-CM | POA: Diagnosis not present

## 2019-06-07 DIAGNOSIS — F3132 Bipolar disorder, current episode depressed, moderate: Secondary | ICD-10-CM | POA: Diagnosis not present

## 2019-06-07 NOTE — Progress Notes (Signed)
NEUROLOGY FOLLOW UP OFFICE NOTE  Jillian [name] OR:[id number]  HISTORY OF PRESENT ILLNESS: Jillian [name] is a 23 year old female who follows up for headaches, visual obscurations and pulsatile tinnitus.  UPDATE: MRI of brain without contrast and MRV of head from 05/02/19 were personally reviewed and were normal.  Lumbar puncture to evaluate opening pressure was ordered but she cancelled it because she said she is unable to afford it.    However, I received Dr. Zenia Resides note from March.  She has open angle with borderline glaucoma, stable.  No evidence of papilledema.    She says headaches have improved, possibly because she has been preoccupied with other things.  She reports 2 headaches in past two weeks, mild, lasting approximately 3 hours, usually in the evening while at work. Current NSAIDS:  none Current analgesics:  Tylenol Current triptans:  none Current ergotamine:  none Current anti-emetic:  none Current muscle relaxants:  none Current anti-anxiolytic:  none Current sleep aide:  trazodone Current Antihypertensive medications:  none Current Antidepressant medications:  Effexor 300mg  twice daily, trazodone Current Anticonvulsant medications:  none Current anti-CGRP:  none Current Vitamins/Herbal/Supplements:  none Current Antihistamines/Decongestants:  none Other therapy:  none Hormone/birth control:  Levonorgestrel  Caffeine:  Cut down.  Drinks tea. Depression; Anxiety:  stable Other pain:  no Sleep hygiene:  good    HISTORY: Beginning in 2019, she has been having headaches, initially occurring twice a week.  She occasionally had mild headaches prior to that.  This year, they have become daily.  They are in back of her head but mostly on either side.  Often occur in evenings.  They are throbbing but can be dull constant.  No associated nausea, vomiting or obvious photophobia or phonophobia.  Usually the are 4/10.  When they are severe, they are up to a 9/10.  She has missed  about 3 days of work to to severity.  She has 4 days of severe headache a month.  Rarely they are so severe that her vision blacks out for a bit.  It usually lasts an hour.  She sometimes notes a whooshing sound in her head.  She saw ophthalmologist Dr. Katy Fitch on 12/15/18 and he asked her if she gets headaches.  He reportedly saw "floaters" but upon questioning the patient, it appears that he didn't tell her she had anything like papilledema.  No identifiable triggers.  She usually takes Tylenol, which helps.  Her mother has migraines.  Her father has glaucoma.   Past NSAIDS:  ibuprofen Past analgesics:  none Past abortive triptans:  none Past abortive ergotamine:  none Past muscle relaxants:  none Past anti-emetic:  none Past antihypertensive medications:  none Past antidepressant medications:  none Past anticonvulsant medications:  none Past anti-CGRP:  none Past vitamins/Herbal/Supplements:  none Past antihistamines/decongestants:  none Other past therapies:  none    PAST MEDICAL HISTORY: Past Medical History:  Diagnosis Date  . Anemia   . Anxiety   . Depression   . Hypoglycemia   . OCD (obsessive compulsive disorder)     MEDICATIONS: Current Outpatient Medications on File Prior to Visit  Medication Sig Dispense Refill  . ARIPiprazole (ABILIFY) 2 MG tablet Take 1 tablet (2 mg total) by mouth daily. (Patient taking differently: Take 2.5 mg by mouth daily. ) 30 tablet 0  . ibuprofen (ADVIL,MOTRIN) 800 MG tablet Take 1 tablet (800 mg total) by mouth every 8 (eight) hours as needed. 60 tablet 1  . levonorgestrel (LILETTA, 52  MG,) 19.5 MCG/DAY IUD IUD 1 each by Intrauterine route once.    . Olopatadine HCl (PATADAY) 0.2 % SOLN Place 2 drops into both eyes daily as needed (For allergies.).    Marland Kitchen traZODone (DESYREL) 100 MG tablet Take 1 tablet (100 mg total) by mouth at bedtime as needed for sleep. 30 tablet 0  . venlafaxine (EFFEXOR) 100 MG tablet Take 300 mg by mouth 2 (two) times  daily.     No current facility-administered medications on file prior to visit.     ALLERGIES: Allergies  Allergen Reactions  . Chicken Allergy Nausea And Vomiting  . Latex     FAMILY HISTORY: Family History  Problem Relation Age of Onset  . Diabetes Mother   . Glaucoma Father   . Cancer Other        unknown  . Diabetes Paternal Grandmother   . Hypertension Paternal Grandfather   . Heart disease Maternal Grandfather   . Healthy Sister        x 3 half  . Eczema Sister    SOCIAL HISTORY: Social History   Socioeconomic History  . Marital status: Single    Spouse name: Not on file  . Number of children: 0  . Years of education: Not on file  . Highest education level: Some college, no degree  Occupational History    Employer: [employer]  Social Needs  . Financial resource strain: Not on file  . Food insecurity    Worry: Not on file    Inability: Not on file  . Transportation needs    Medical: Not on file    Non-medical: Not on file  Tobacco Use  . Smoking status: Never Smoker  . Smokeless tobacco: Never Used  Substance and Sexual Activity  . Alcohol use: No    Alcohol/week: 0.0 standard drinks  . Drug use: No  . Sexual activity: Never    Birth control/protection: I.U.D.  Lifestyle  . Physical activity    Days per week: Not on file    Minutes per session: Not on file  . Stress: Not on file  Relationships  . Social Herbalist on phone: Not on file    Gets together: Not on file    Attends religious service: Not on file    Active member of club or organization: Not on file    Attends meetings of clubs or organizations: Not on file    Relationship status: Not on file  . Intimate partner violence    Fear of current or ex partner: Not on file    Emotionally abused: Not on file    Physically abused: Not on file    Forced sexual activity: Not on file  Other Topics Concern  . Not on file  Social History Narrative   Patient is right-handed. She  lives alone in a one level home. She drinks 2 cups of tea a day, and 1 16 oz bottle of Mt. Dew a day. She does not regularly exercise.    REVIEW OF SYSTEMS: Constitutional: No fevers, chills, or sweats, no generalized fatigue, change in appetite Eyes: No visual changes, double vision, eye pain Ear, nose and throat: No hearing loss, ear pain, nasal congestion, sore throat Cardiovascular: No chest pain, palpitations Respiratory:  No shortness of breath at rest or with exertion, wheezes GastrointestinaI: No nausea, vomiting, diarrhea, abdominal pain, fecal incontinence Genitourinary:  No dysuria, urinary retention or frequency Musculoskeletal:  No neck pain, back pain Integumentary: No  rash, pruritus, skin lesions Neurological: as above Psychiatric: No depression, insomnia, anxiety Endocrine: No palpitations, fatigue, diaphoresis, mood swings, change in appetite, change in weight, increased thirst Hematologic/Lymphatic:  No purpura, petechiae. Allergic/Immunologic: no itchy/runny eyes, nasal congestion, recent allergic reactions, rashes  PHYSICAL EXAM: Blood pressure 114/79, pulse 79, temperature (!) 97.1 F (36.2 C), height 5\' 4"  (1.626 m), weight 218 lb (98.9 kg), SpO2 99 %. General: No acute distress.  Patient appears well-groomed.   Head:  Normocephalic/atraumatic Eyes:  Fundi examined but not visualized Neck: supple, no paraspinal tenderness, full range of motion Heart:  Regular rate and rhythm Lungs:  Clear to auscultation bilaterally Back: No paraspinal tenderness Neurological Exam: alert and oriented to person, place, and time. Attention span and concentration intact, recent and remote memory intact, fund of knowledge intact.  Speech fluent and not dysarthric, language intact.  CN II-XII intact. Bulk and tone normal, muscle strength 5/5 throughout.  Sensation to light touch  intact.  Deep tendon reflexes 2+ throughout, toes downgoing.  Finger to nose testing intact.  Gait normal,  Romberg negative.  IMPRESSION: Tension-type headaches, not intractable.  Improved.  Eye exam negative for papilledema.  MRI/MRV of head unremarkable.  As they have improved, will not start any medications.  She may follow up as needed.  15 minutes spent face to face with patient, over 50% spent discussing management.  Metta Clines, DO

## 2019-06-08 ENCOUNTER — Encounter: Payer: Self-pay | Admitting: Neurology

## 2019-06-08 ENCOUNTER — Other Ambulatory Visit: Payer: Self-pay

## 2019-06-08 ENCOUNTER — Ambulatory Visit (INDEPENDENT_AMBULATORY_CARE_PROVIDER_SITE_OTHER): Payer: BC Managed Care – PPO | Admitting: Neurology

## 2019-06-08 VITALS — BP 114/79 | HR 79 | Temp 97.1°F | Ht 64.0 in | Wt 218.0 lb

## 2019-06-08 DIAGNOSIS — G44219 Episodic tension-type headache, not intractable: Secondary | ICD-10-CM

## 2019-06-14 DIAGNOSIS — F3132 Bipolar disorder, current episode depressed, moderate: Secondary | ICD-10-CM | POA: Diagnosis not present

## 2019-06-21 DIAGNOSIS — F3132 Bipolar disorder, current episode depressed, moderate: Secondary | ICD-10-CM | POA: Diagnosis not present

## 2019-07-05 DIAGNOSIS — F3132 Bipolar disorder, current episode depressed, moderate: Secondary | ICD-10-CM | POA: Diagnosis not present

## 2019-07-12 DIAGNOSIS — F3132 Bipolar disorder, current episode depressed, moderate: Secondary | ICD-10-CM | POA: Diagnosis not present

## 2019-07-19 DIAGNOSIS — F3132 Bipolar disorder, current episode depressed, moderate: Secondary | ICD-10-CM | POA: Diagnosis not present

## 2019-07-24 DIAGNOSIS — F429 Obsessive-compulsive disorder, unspecified: Secondary | ICD-10-CM | POA: Diagnosis not present

## 2019-07-24 DIAGNOSIS — F418 Other specified anxiety disorders: Secondary | ICD-10-CM | POA: Diagnosis not present

## 2019-07-24 DIAGNOSIS — F251 Schizoaffective disorder, depressive type: Secondary | ICD-10-CM | POA: Diagnosis not present

## 2019-07-26 DIAGNOSIS — F3132 Bipolar disorder, current episode depressed, moderate: Secondary | ICD-10-CM | POA: Diagnosis not present

## 2019-08-06 ENCOUNTER — Other Ambulatory Visit: Payer: Self-pay | Admitting: *Deleted

## 2019-08-06 DIAGNOSIS — Z20822 Contact with and (suspected) exposure to covid-19: Secondary | ICD-10-CM

## 2019-08-06 DIAGNOSIS — Z20828 Contact with and (suspected) exposure to other viral communicable diseases: Secondary | ICD-10-CM | POA: Diagnosis not present

## 2019-08-07 LAB — NOVEL CORONAVIRUS, NAA: SARS-CoV-2, NAA: NOT DETECTED

## 2019-08-16 DIAGNOSIS — F3132 Bipolar disorder, current episode depressed, moderate: Secondary | ICD-10-CM | POA: Diagnosis not present

## 2019-08-30 DIAGNOSIS — F3132 Bipolar disorder, current episode depressed, moderate: Secondary | ICD-10-CM | POA: Diagnosis not present

## 2019-09-06 DIAGNOSIS — F3132 Bipolar disorder, current episode depressed, moderate: Secondary | ICD-10-CM | POA: Diagnosis not present

## 2019-09-08 DIAGNOSIS — F429 Obsessive-compulsive disorder, unspecified: Secondary | ICD-10-CM | POA: Diagnosis not present

## 2019-09-08 DIAGNOSIS — F349 Persistent mood [affective] disorder, unspecified: Secondary | ICD-10-CM | POA: Diagnosis not present

## 2019-09-16 IMAGING — MR MRI HEAD WITHOUT CONTRAST
11 series · 44 of 48 positions shown · non-contrast
Comparison: None.

CLINICAL DATA: Headaches over the last 9 months without no known
etiology. Some confusion and visual disturbance.

EXAM:
MRI HEAD WITHOUT CONTRAST
TECHNIQUE: Multiplanar, multiecho pulse sequences of the brain and surrounding
structures were obtained without intravenous contrast.

[Series 7: T1 · sagittal · 5.0mm · 0.45mm/px · 1 of 23 slices shown]
[im 1/23]
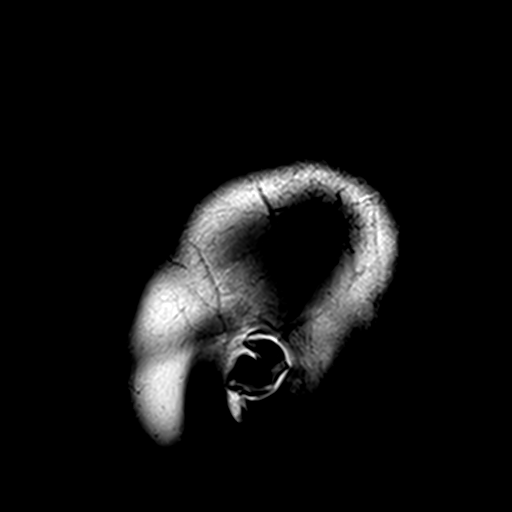

[Series 8: DWI · axial · 3.0mm · 1.80mm/px · z∈[-24,+121]mm · 7 of 100 slices shown (1 of 4)]
[im 1/100]
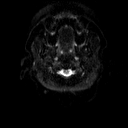
[im 17/100]
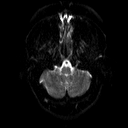
[im 34/100]
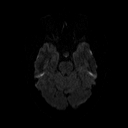
[im 50/100]
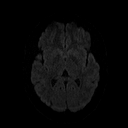
[im 67/100]
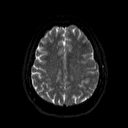
[im 83/100]
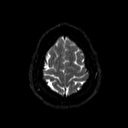
[im 100/100]
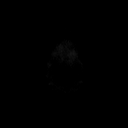

[Series 9: DWI · axial · 3.0mm · 1.80mm/px · z∈[-24,+121]mm · 4 of 50 slices shown (2 of 4)]
[im 1/50]
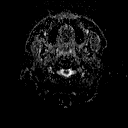
[im 17/50]
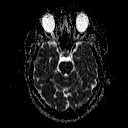
[im 33/50]
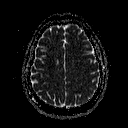
[im 50/50]
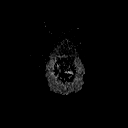

[Series 11: swi_images · axial · 2.0mm · 0.90mm/px · z∈[-30,+126]mm · 6 of 80 slices shown]
[im 1/80]
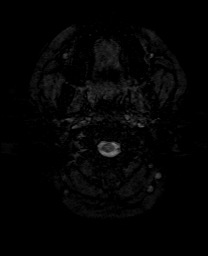
[im 16/80]
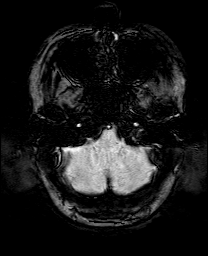
[im 32/80]
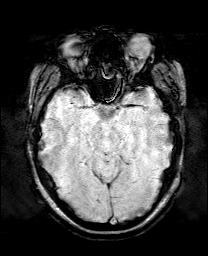
[im 48/80]
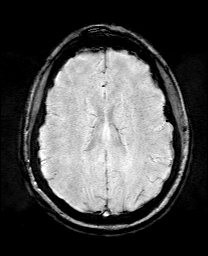
[im 64/80]
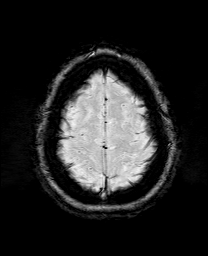
[im 80/80]
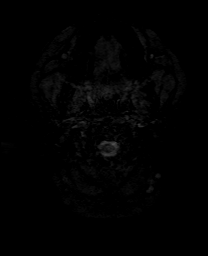

[Series 12: DWI · coronal · 5.0mm · 1.80mm/px · 5 of 67 slices shown (3 of 4)]
[im 1/67]
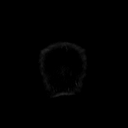
[im 17/67]
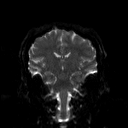
[im 34/67]
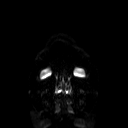
[im 50/67]
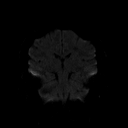
[im 67/67]
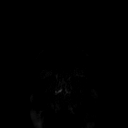

[Series 13: DWI · coronal · 5.0mm · 1.80mm/px · 2 of 34 slices shown (4 of 4)]
[im 1/34]
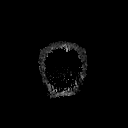
[im 34/34]
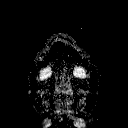

[Series 14: T2 · axial · 5.0mm · 0.51mm/px · z∈[-28,+113]mm · 2 of 22 slices shown (1 of 2)]
[im 1/22]
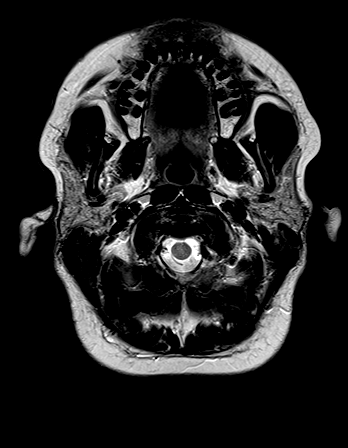
[im 22/22]
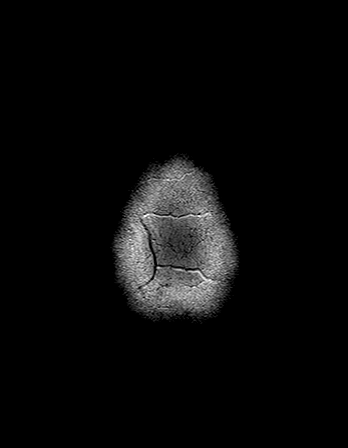

[Series 15: FLAIR · axial · 3.0mm · 0.45mm/px · z∈[-35,+120]mm · 2 of 27 slices shown]
[im 1/27]
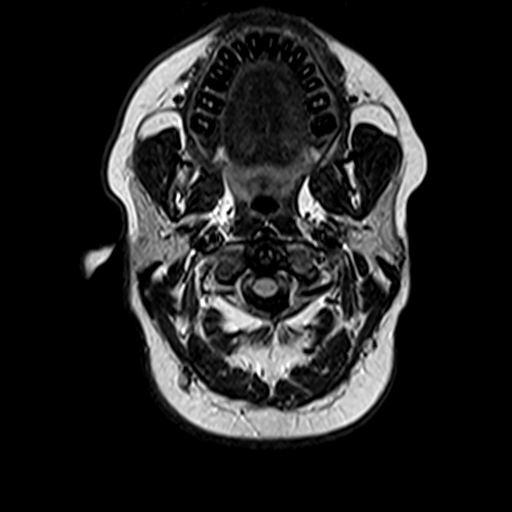
[im 27/27]
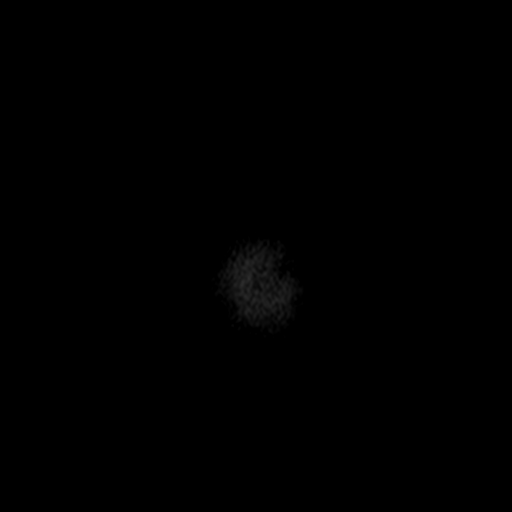

[Series 16: t1_mpr_tra copy center · axial · 1.0mm · 0.45mm/px · z∈[-28,+114]mm · 8 of 144 slices shown]
[im 1/144]
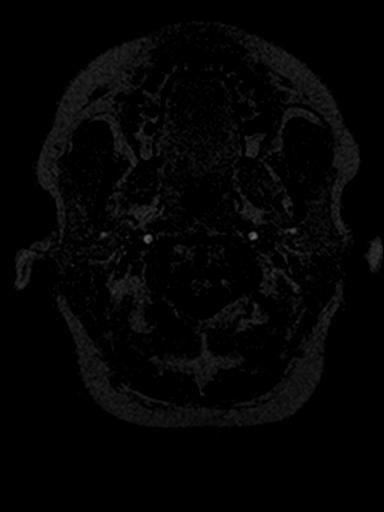
[im 16/144]
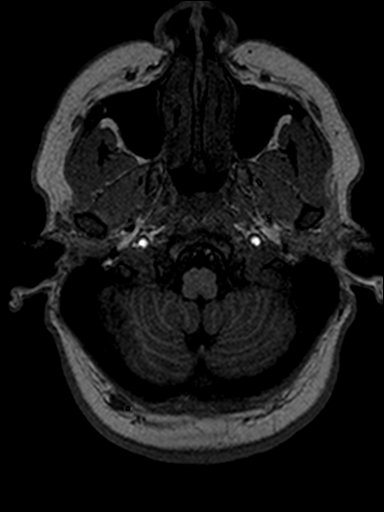
[im 48/144]
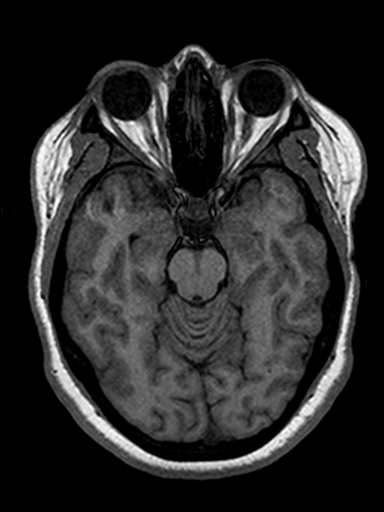
[im 64/144]
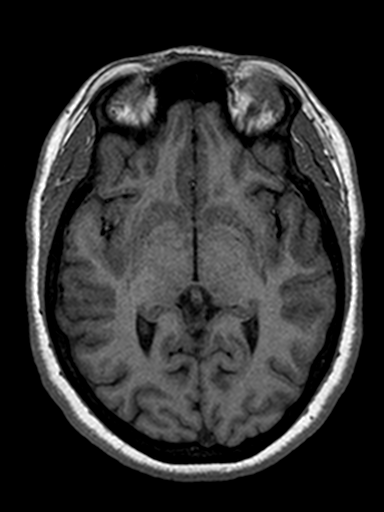
[im 80/144]
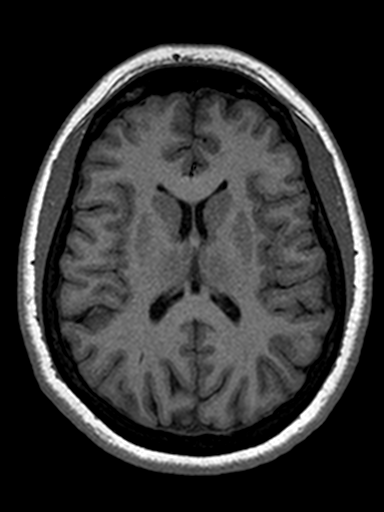
[im 96/144]
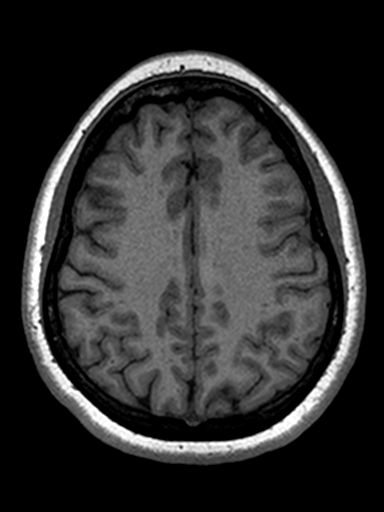
[im 128/144]
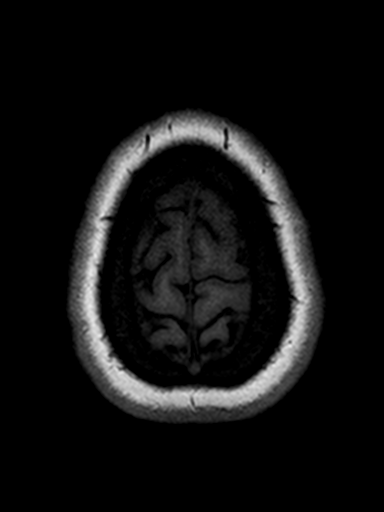
[im 144/144]
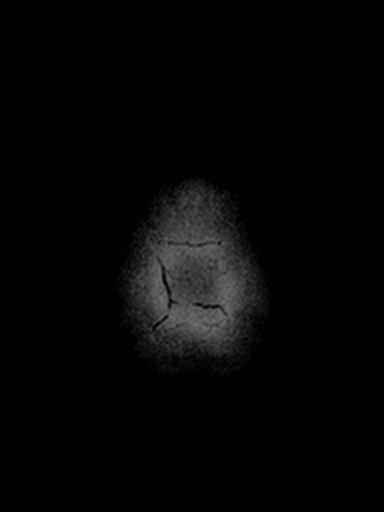

[Series 17: T2 · coronal · 5.0mm · 0.45mm/px · 2 of 29 slices shown (2 of 2)]
[im 1/29]
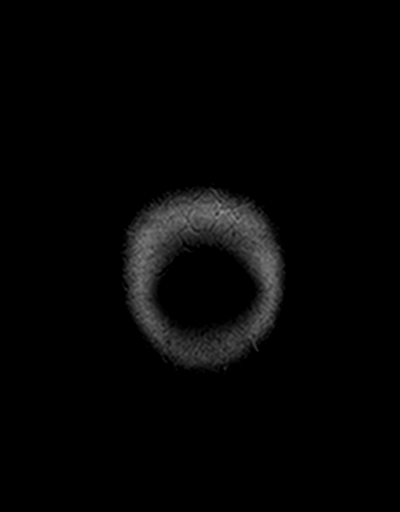
[im 29/29]
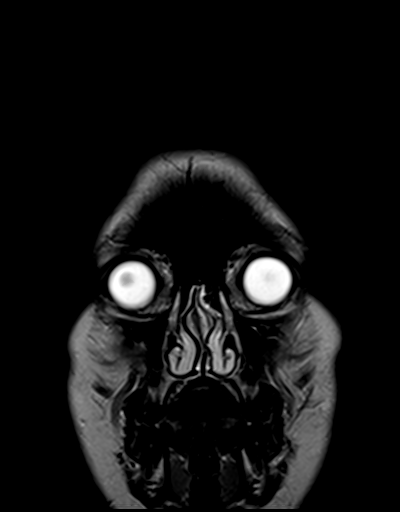

[Series 18: (id) coronal · coronal · 3.0mm · 0.49mm/px · 5 of 100 slices shown]
[im 1/100]
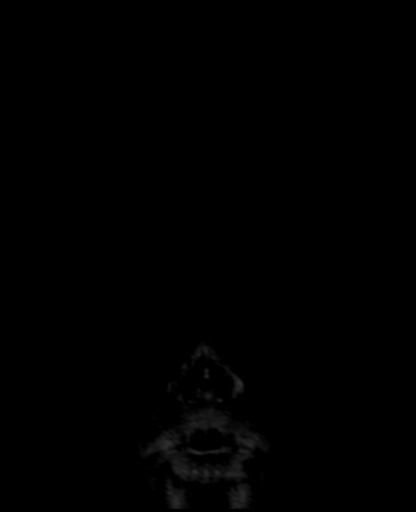
[im 17/100]
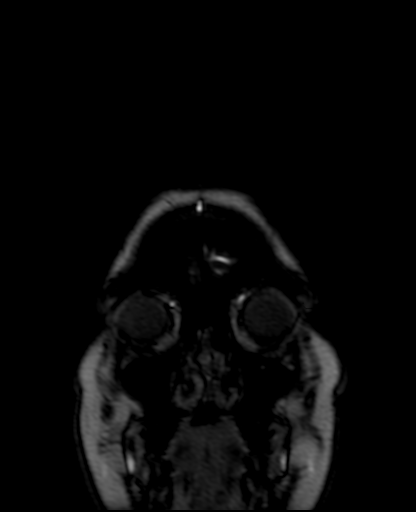
[im 34/100]
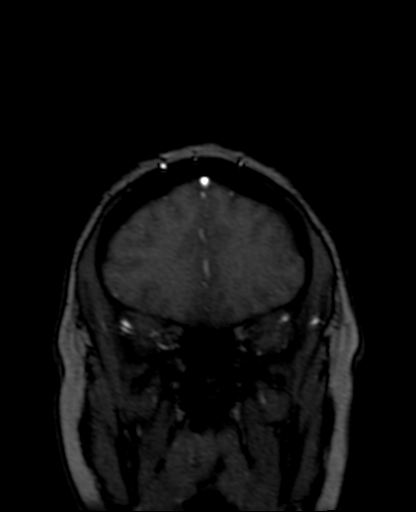
[im 50/100]
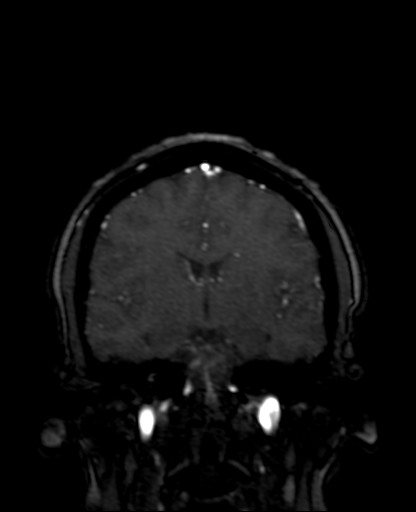
[im 67/100]
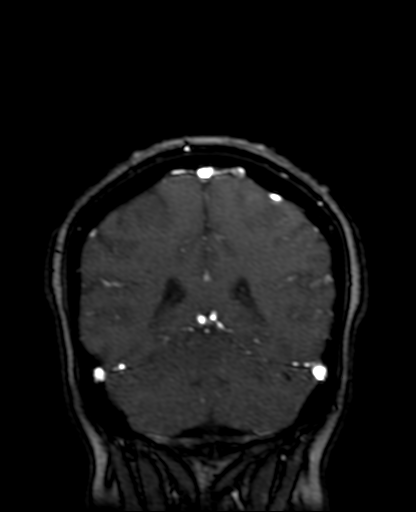

[44 of 48 positions shown; findings below may reference images not displayed]

FINDINGS: Brain: The brain has a normal appearance without evidence of
malformation, atrophy, old or acute small or large vessel
infarction, mass lesion, hemorrhage, hydrocephalus or extra-axial
collection.

Vascular: Major vessels at the base of the brain show flow. Venous
sinuses appear patent.

Skull and upper cervical spine: Normal.

Sinuses/Orbits: Clear/normal.

Other: None significant.
IMPRESSION: Normal examination.  No cause of headache identified.

## 2019-09-27 DIAGNOSIS — F3132 Bipolar disorder, current episode depressed, moderate: Secondary | ICD-10-CM | POA: Diagnosis not present

## 2019-10-04 DIAGNOSIS — F3132 Bipolar disorder, current episode depressed, moderate: Secondary | ICD-10-CM | POA: Diagnosis not present

## 2019-10-11 DIAGNOSIS — F3132 Bipolar disorder, current episode depressed, moderate: Secondary | ICD-10-CM | POA: Diagnosis not present

## 2019-10-18 DIAGNOSIS — F3132 Bipolar disorder, current episode depressed, moderate: Secondary | ICD-10-CM | POA: Diagnosis not present

## 2019-10-25 DIAGNOSIS — F3132 Bipolar disorder, current episode depressed, moderate: Secondary | ICD-10-CM | POA: Diagnosis not present

## 2019-11-01 DIAGNOSIS — F3132 Bipolar disorder, current episode depressed, moderate: Secondary | ICD-10-CM | POA: Diagnosis not present

## 2019-11-08 DIAGNOSIS — F3132 Bipolar disorder, current episode depressed, moderate: Secondary | ICD-10-CM | POA: Diagnosis not present

## 2019-11-15 DIAGNOSIS — F3132 Bipolar disorder, current episode depressed, moderate: Secondary | ICD-10-CM | POA: Diagnosis not present

## 2019-11-22 DIAGNOSIS — F3132 Bipolar disorder, current episode depressed, moderate: Secondary | ICD-10-CM | POA: Diagnosis not present

## 2019-11-29 DIAGNOSIS — F3132 Bipolar disorder, current episode depressed, moderate: Secondary | ICD-10-CM | POA: Diagnosis not present

## 2019-12-05 DIAGNOSIS — F429 Obsessive-compulsive disorder, unspecified: Secondary | ICD-10-CM | POA: Diagnosis not present

## 2019-12-05 DIAGNOSIS — F251 Schizoaffective disorder, depressive type: Secondary | ICD-10-CM | POA: Diagnosis not present

## 2019-12-06 DIAGNOSIS — F3132 Bipolar disorder, current episode depressed, moderate: Secondary | ICD-10-CM | POA: Diagnosis not present

## 2019-12-13 DIAGNOSIS — F3132 Bipolar disorder, current episode depressed, moderate: Secondary | ICD-10-CM | POA: Diagnosis not present

## 2019-12-20 DIAGNOSIS — F3132 Bipolar disorder, current episode depressed, moderate: Secondary | ICD-10-CM | POA: Diagnosis not present

## 2019-12-27 DIAGNOSIS — F3132 Bipolar disorder, current episode depressed, moderate: Secondary | ICD-10-CM | POA: Diagnosis not present

## 2020-01-03 DIAGNOSIS — F3132 Bipolar disorder, current episode depressed, moderate: Secondary | ICD-10-CM | POA: Diagnosis not present

## 2020-01-10 DIAGNOSIS — F3132 Bipolar disorder, current episode depressed, moderate: Secondary | ICD-10-CM | POA: Diagnosis not present

## 2020-01-17 DIAGNOSIS — F3132 Bipolar disorder, current episode depressed, moderate: Secondary | ICD-10-CM | POA: Diagnosis not present

## 2020-01-24 DIAGNOSIS — F3132 Bipolar disorder, current episode depressed, moderate: Secondary | ICD-10-CM | POA: Diagnosis not present

## 2020-01-31 DIAGNOSIS — F3132 Bipolar disorder, current episode depressed, moderate: Secondary | ICD-10-CM | POA: Diagnosis not present

## 2020-02-07 DIAGNOSIS — F3132 Bipolar disorder, current episode depressed, moderate: Secondary | ICD-10-CM | POA: Diagnosis not present

## 2020-02-14 DIAGNOSIS — F3132 Bipolar disorder, current episode depressed, moderate: Secondary | ICD-10-CM | POA: Diagnosis not present

## 2020-02-21 DIAGNOSIS — F3132 Bipolar disorder, current episode depressed, moderate: Secondary | ICD-10-CM | POA: Diagnosis not present

## 2020-02-28 DIAGNOSIS — F3132 Bipolar disorder, current episode depressed, moderate: Secondary | ICD-10-CM | POA: Diagnosis not present

## 2020-03-05 ENCOUNTER — Ambulatory Visit: Payer: BC Managed Care – PPO | Admitting: Obstetrics & Gynecology

## 2020-03-05 ENCOUNTER — Encounter: Payer: Self-pay | Admitting: Obstetrics & Gynecology

## 2020-03-05 ENCOUNTER — Other Ambulatory Visit: Payer: Self-pay

## 2020-03-05 VITALS — BP 106/70 | HR 101 | Ht 64.0 in | Wt 229.0 lb

## 2020-03-05 DIAGNOSIS — Z30432 Encounter for removal of intrauterine contraceptive device: Secondary | ICD-10-CM | POA: Diagnosis not present

## 2020-03-05 DIAGNOSIS — Z30017 Encounter for initial prescription of implantable subdermal contraceptive: Secondary | ICD-10-CM

## 2020-03-05 MED ORDER — ETONOGESTREL 68 MG ~~LOC~~ IMPL
68.0000 mg | DRUG_IMPLANT | Freq: Once | SUBCUTANEOUS | Status: AC
  Administered 2020-03-05: 68 mg via SUBCUTANEOUS

## 2020-03-05 NOTE — Patient Instructions (Signed)
Etonogestrel implant What is this medicine? ETONOGESTREL (et oh noe JES trel) is a contraceptive (birth control) device. It is used to prevent pregnancy. It can be used for up to 3 years. This medicine may be used for other purposes; ask your health care provider or pharmacist if you have questions. COMMON BRAND NAME(S): Implanon, Nexplanon What should I tell my health care provider before I take this medicine? They need to know if you have any of these conditions:  abnormal vaginal bleeding  blood vessel disease or blood clots  breast, cervical, endometrial, ovarian, liver, or uterine cancer  diabetes  gallbladder disease  heart disease or recent heart attack  high blood pressure  high cholesterol or triglycerides  kidney disease  liver disease  migraine headaches  seizures  stroke  tobacco smoker  an unusual or allergic reaction to etonogestrel, anesthetics or antiseptics, other medicines, foods, dyes, or preservatives  pregnant or trying to get pregnant  breast-feeding How should I use this medicine? This device is inserted just under the skin on the inner side of your upper arm by a health care professional. Talk to your pediatrician regarding the use of this medicine in children. Special care may be needed. Overdosage: If you think you have taken too much of this medicine contact a poison control center or emergency room at once. NOTE: This medicine is only for you. Do not share this medicine with others. What if I miss a dose? This does not apply. What may interact with this medicine? Do not take this medicine with any of the following medications:  amprenavir  fosamprenavir This medicine may also interact with the following medications:  acitretin  aprepitant  armodafinil  bexarotene  bosentan  carbamazepine  certain medicines for fungal infections like fluconazole, ketoconazole, itraconazole and voriconazole  certain medicines to treat  hepatitis, HIV or AIDS  cyclosporine  felbamate  griseofulvin  lamotrigine  modafinil  oxcarbazepine  phenobarbital  phenytoin  primidone  rifabutin  rifampin  rifapentine  St. John's wort  topiramate This list may not describe all possible interactions. Give your health care provider a list of all the medicines, herbs, non-prescription drugs, or dietary supplements you use. Also tell them if you smoke, drink alcohol, or use illegal drugs. Some items may interact with your medicine. What should I watch for while using this medicine? This product does not protect you against HIV infection (AIDS) or other sexually transmitted diseases. You should be able to feel the implant by pressing your fingertips over the skin where it was inserted. Contact your doctor if you cannot feel the implant, and use a non-hormonal birth control method (such as condoms) until your doctor confirms that the implant is in place. Contact your doctor if you think that the implant may have broken or become bent while in your arm. You will receive a user card from your health care provider after the implant is inserted. The card is a record of the location of the implant in your upper arm and when it should be removed. Keep this card with your health records. What side effects may I notice from receiving this medicine? Side effects that you should report to your doctor or health care professional as soon as possible:  allergic reactions like skin rash, itching or hives, swelling of the face, lips, or tongue  breast lumps, breast tissue changes, or discharge  breathing problems  changes in emotions or moods  coughing up blood  if you feel that the implant   may have broken or bent while in your arm  high blood pressure  pain, irritation, swelling, or bruising at the insertion site  scar at site of insertion  signs of infection at the insertion site such as fever, and skin redness, pain or  discharge  signs and symptoms of a blood clot such as breathing problems; changes in vision; chest pain; severe, sudden headache; pain, swelling, warmth in the leg; trouble speaking; sudden numbness or weakness of the face, arm or leg  signs and symptoms of liver injury like dark yellow or brown urine; general ill feeling or flu-like symptoms; light-colored stools; loss of appetite; nausea; right upper belly pain; unusually weak or tired; yellowing of the eyes or skin  unusual vaginal bleeding, discharge Side effects that usually do not require medical attention (report to your doctor or health care professional if they continue or are bothersome):  acne  breast pain or tenderness  headache  irregular menstrual bleeding  nausea This list may not describe all possible side effects. Call your doctor for medical advice about side effects. You may report side effects to FDA at 1-800-FDA-1088. Where should I keep my medicine? This drug is given in a hospital or clinic and will not be stored at home. NOTE: This sheet is a summary. It may not cover all possible information. If you have questions about this medicine, talk to your doctor, pharmacist, or health care provider.  2020 Elsevier/Gold Standard (2019-06-26 11:33:04)  

## 2020-03-05 NOTE — Progress Notes (Addendum)
GYN presents for IUD removal and Nexplanon Insert. Last PAP 09/13/2018    Administrations This Visit    etonogestrel (NEXPLANON) implant 68 mg    Admin Date 03/05/2020 Action Given Dose 68 mg Route Subdermal Administered By Tamela Oddi, RMA

## 2020-03-05 NOTE — Progress Notes (Signed)
    GYNECOLOGY OFFICE PROCEDURE NOTE  Jillian [name] is a 24 y.o. G0 here for [place] IUD removal and Nexplanon insertion. No GYN concerns.  Last pap smear was on 12/19 and was normal.  IUD Removal  Patient identified, informed consent performed, consent signed.  Patient was in the dorsal lithotomy position, normal external genitalia was noted.  A speculum was placed in the patient's vagina, normal discharge was noted, no lesions. The cervix was visualized, no lesions, no abnormal discharge.  The strings of the IUD were grasped and pulled using ring forceps. The IUD was removed in its entirety.  Patient tolerated the procedure well.    Patient will use nexplanon for contraception.  Routine preventative health maintenance measures emphasized.  Nexplanon Insertion Procedure Patient identified, informed consent performed, consent signed.   Patient does understand that irregular bleeding is a very common side effect of this medication. She was advised to have backup contraception for one week after placement. Pregnancy test in clinic today was negative.  Appropriate time out taken.  Patient's left arm was prepped and draped in the usual sterile fashion. The ruler used to measure and mark insertion area.  Patient was prepped with alcohol swab and then injected with 3 ml of 1% lidocaine.  She was prepped with betadine, Nexplanon removed from packaging,  Device confirmed in needle, then inserted full length of needle and withdrawn per handbook instructions. Nexplanon was able to palpated in the patient's arm; patient palpated the insert herself. There was minimal blood loss.  Patient insertion site covered with guaze and a pressure bandage to reduce any bruising.  The patient tolerated the procedure well and was given post procedure instructions.    [name], MD, [place] [facility], [place]

## 2020-03-06 DIAGNOSIS — F3132 Bipolar disorder, current episode depressed, moderate: Secondary | ICD-10-CM | POA: Diagnosis not present

## 2020-03-13 DIAGNOSIS — F3132 Bipolar disorder, current episode depressed, moderate: Secondary | ICD-10-CM | POA: Diagnosis not present

## 2020-03-20 DIAGNOSIS — F3132 Bipolar disorder, current episode depressed, moderate: Secondary | ICD-10-CM | POA: Diagnosis not present

## 2020-04-03 DIAGNOSIS — F3132 Bipolar disorder, current episode depressed, moderate: Secondary | ICD-10-CM | POA: Diagnosis not present

## 2020-04-07 ENCOUNTER — Encounter: Payer: Self-pay | Admitting: Obstetrics and Gynecology

## 2020-04-07 ENCOUNTER — Other Ambulatory Visit: Payer: Self-pay

## 2020-04-07 ENCOUNTER — Ambulatory Visit: Payer: BC Managed Care – PPO | Admitting: Obstetrics and Gynecology

## 2020-04-07 VITALS — BP 131/85 | HR 99 | Wt 226.5 lb

## 2020-04-07 DIAGNOSIS — H40009 Preglaucoma, unspecified, unspecified eye: Secondary | ICD-10-CM | POA: Diagnosis not present

## 2020-04-07 DIAGNOSIS — G44229 Chronic tension-type headache, not intractable: Secondary | ICD-10-CM | POA: Insufficient documentation

## 2020-04-07 DIAGNOSIS — N921 Excessive and frequent menstruation with irregular cycle: Secondary | ICD-10-CM

## 2020-04-07 NOTE — Progress Notes (Signed)
   Subjective:    Patient ID: Jillian [name], female    DOB: 1996-03-13, 24 y.o.   MRN: [id number]  HPI  Patient presents for follow up of Nexplanon insertion about 1 month ago.  The patient complains of constant spotting with the Nexplanon.  She denies any heavy bleeding, abnormal discharge or abdominal pain.  The patient reports a long history of headaches.  She was seen in September of 2020 and was told that she has tension headaches.  The MRI of the brain was normal.  The note from September states that the patient was seen by Dr. Katy Fitch in March of 2020 and was diagnosed with open angle borderline glaucoma, which was stable.  She has not followed up with Dr. Katy Fitch.  The patient reports some floaters in her eyes.      Review of Systems Other than the HPI, the 13 point review of symptoms is negative.    Objective:   Physical Exam Constitutional:      Appearance: Normal appearance.  HENT:     Head: Normocephalic and atraumatic.  Eyes:     Conjunctiva/sclera: Conjunctivae normal.  Musculoskeletal:        General: Normal range of motion.  Skin:    General: Skin is warm and dry.  Neurological:     General: No focal deficit present.     Mental Status: She is alert.  Psychiatric:        Mood and Affect: Mood normal.        Behavior: Behavior normal.        Judgment: Judgment normal.    Nexplanon palpated in the correct position in the left arm.  No erythema or bruising noted.  The rod is felt to be intact and 4 mm in length.         Assessment & Plan:  Metrorrhagia  Borderline glaucoma, unspecified laterality  Chronic tension-type headache, not intractable  Explained to patient that the current vaginal spotting is expected up to 3 months after Nexplanon insertion. Patient to make an appointment with Dr. Shanon Rosser.  She will have the records sent to this office. Discussed with patient that she may have to change to a non-hormonal form of contraception secondary to glaucoma. Patient  to return in 1 month for her annual exam.

## 2020-04-10 DIAGNOSIS — F3132 Bipolar disorder, current episode depressed, moderate: Secondary | ICD-10-CM | POA: Diagnosis not present

## 2020-04-16 DIAGNOSIS — L83 Acanthosis nigricans: Secondary | ICD-10-CM | POA: Diagnosis not present

## 2020-04-16 DIAGNOSIS — E559 Vitamin D deficiency, unspecified: Secondary | ICD-10-CM | POA: Diagnosis not present

## 2020-04-16 DIAGNOSIS — Z87898 Personal history of other specified conditions: Secondary | ICD-10-CM | POA: Diagnosis not present

## 2020-04-16 DIAGNOSIS — M79609 Pain in unspecified limb: Secondary | ICD-10-CM | POA: Diagnosis not present

## 2020-04-16 DIAGNOSIS — Z833 Family history of diabetes mellitus: Secondary | ICD-10-CM | POA: Diagnosis not present

## 2020-04-16 DIAGNOSIS — E119 Type 2 diabetes mellitus without complications: Secondary | ICD-10-CM | POA: Diagnosis not present

## 2020-04-16 DIAGNOSIS — R202 Paresthesia of skin: Secondary | ICD-10-CM | POA: Diagnosis not present

## 2020-04-17 DIAGNOSIS — F3132 Bipolar disorder, current episode depressed, moderate: Secondary | ICD-10-CM | POA: Diagnosis not present

## 2020-04-24 DIAGNOSIS — R7309 Other abnormal glucose: Secondary | ICD-10-CM | POA: Diagnosis not present

## 2020-04-24 DIAGNOSIS — F3132 Bipolar disorder, current episode depressed, moderate: Secondary | ICD-10-CM | POA: Diagnosis not present

## 2020-05-01 DIAGNOSIS — F3132 Bipolar disorder, current episode depressed, moderate: Secondary | ICD-10-CM | POA: Diagnosis not present

## 2020-05-06 ENCOUNTER — Other Ambulatory Visit: Payer: Self-pay

## 2020-05-06 ENCOUNTER — Encounter: Payer: Self-pay | Admitting: Obstetrics and Gynecology

## 2020-05-06 ENCOUNTER — Other Ambulatory Visit (HOSPITAL_COMMUNITY)
Admission: RE | Admit: 2020-05-06 | Discharge: 2020-05-06 | Disposition: A | Discharge status: Home / Self Care | Payer: BC Managed Care – PPO | Source: Ambulatory Visit | Attending: Obstetrics and Gynecology | Admitting: Obstetrics and Gynecology

## 2020-05-06 ENCOUNTER — Ambulatory Visit: Payer: BC Managed Care – PPO | Admitting: Obstetrics and Gynecology

## 2020-05-06 VITALS — BP 105/71 | HR 90 | Ht 64.0 in | Wt 219.0 lb

## 2020-05-06 DIAGNOSIS — Z01419 Encounter for gynecological examination (general) (routine) without abnormal findings: Secondary | ICD-10-CM

## 2020-05-06 NOTE — Progress Notes (Signed)
GYN presents for Nexplanon FU.  She reports no complaints today. Denies spotting and HA.

## 2020-05-06 NOTE — Progress Notes (Addendum)
GYNECOLOGY ANNUAL PREVENTATIVE CARE ENCOUNTER NOTE  Subjective:   Jillian [name] is a 24 y.o. G0P0000 female here for a routine annual gynecologic exam.  Current complaints: none.   Denies abnormal vaginal bleeding, discharge, pelvic pain, problems with intercourse or other gynecologic concerns.    Gynecologic History No LMP recorded. Patient has had an implant. Patient is not sexually active  Contraception: none Last Pap: n/a. Last mammogram: n/a.  Obstetric History OB History  Gravida Para Term Preterm AB Living  0 0 0 0 0 0  SAB TAB Ectopic Multiple Live Births  0 0 0 0      Past Medical History:  Diagnosis Date  . Anemia   . Anxiety   . Borderline glaucoma   . Depression   . Hypoglycemia   . OCD (obsessive compulsive disorder)     Past Surgical History:  Procedure Laterality Date  . DENTAL SURGERY    . WISDOM TOOTH EXTRACTION Bilateral     Current Outpatient Medications on File Prior to Visit  Medication Sig Dispense Refill  . ARIPiprazole (ABILIFY) 2 MG tablet Take 1 tablet (2 mg total) by mouth daily. 30 tablet 0  . etonogestrel (NEXPLANON) 68 MG IMPL implant 1 each by Subdermal route once.    . traZODone (DESYREL) 100 MG tablet Take 1 tablet (100 mg total) by mouth at bedtime as needed for sleep. 30 tablet 0  . venlafaxine (EFFEXOR) 100 MG tablet Take 300 mg by mouth 2 (two) times daily.    Marland Kitchen ibuprofen (ADVIL,MOTRIN) 800 MG tablet Take 1 tablet (800 mg total) by mouth every 8 (eight) hours as needed. (Patient not taking: Reported on 03/05/2020) 60 tablet 1  . nabumetone (RELAFEN) 500 MG tablet 500 mg. No more than 3 times a week - takes rarely (Patient not taking: Reported on 05/06/2020)    . Olopatadine HCl (PATADAY) 0.2 % SOLN Place 2 drops into both eyes daily as needed (For allergies.). (Patient not taking: Reported on 04/07/2020)     No current facility-administered medications on file prior to visit.    Allergies  Allergen Reactions  . Chicken Allergy  Nausea And Vomiting  . Latex     Social History   Socioeconomic History  . Marital status: Single    Spouse name: Not on file  . Number of children: 0  . Years of education: Not on file  . Highest education level: Some college, no degree  Occupational History    Employer: [employer]  Tobacco Use  . Smoking status: Never Smoker  . Smokeless tobacco: Never Used  Substance and Sexual Activity  . Alcohol use: No    Alcohol/week: 0.0 standard drinks  . Drug use: No  . Sexual activity: Never    Birth control/protection: I.U.D.  Other Topics Concern  . Not on file  Social History Narrative   Patient is right-handed. She lives alone in a one level home. She drinks 2 cups of tea a day, and rarely soft drinks. She does not regularly exercise. Some college - in school now.    Social Determinants of Health   Financial Resource Strain:   . Difficulty of Paying Living Expenses:   Food Insecurity:   . Worried About Charity fundraiser in the Last Year:   . Arboriculturist in the Last Year:   Transportation Needs:   . Film/video editor (Medical):   Marland Kitchen Lack of Transportation (Non-Medical):   Physical Activity:   .  Days of Exercise per Week:   . Minutes of Exercise per Session:   Stress:   . Feeling of Stress :   Social Connections:   . Frequency of Communication with Friends and Family:   . Frequency of Social Gatherings with Friends and Family:   . Attends Religious Services:   . Active Member of Clubs or Organizations:   . Attends Archivist Meetings:   Marland Kitchen Marital Status:   Intimate Partner Violence:   . Fear of Current or Ex-Partner:   . Emotionally Abused:   Marland Kitchen Physically Abused:   . Sexually Abused:     Family History  Problem Relation Age of Onset  . Diabetes Mother   . Glaucoma Father   . Cancer Other        unknown  . Diabetes Paternal Grandmother   . Hypertension Paternal Grandfather   . Heart disease Maternal Grandfather   . Healthy Sister          x 3 half  . Eczema Sister     The following portions of the patient's history were reviewed and updated as appropriate: allergies, current medications, past family history, past medical history, past social history, past surgical history and problem list.  Review of Systems A comprehensive review of systems was negative.   Objective:  BP 105/71   Pulse 90   Ht 5\' 4"  (1.626 m)   Wt 219 lb (99.3 kg)   BMI 37.59 kg/m  Wt Readings from Last 3 Encounters:  05/06/20 219 lb (99.3 kg)  04/07/20 226 lb 8 oz (102.7 kg)  03/05/20 229 lb (103.9 kg)     Chaperone present during exam  CONSTITUTIONAL: Well-developed, well-nourished female in no acute distress.  HENT:  Normocephalic, atraumatic, External right and left ear normal. Oropharynx is clear and moist EYES: Conjunctivae and EOM are normal. Pupils are equal, round, and reactive to light. No scleral icterus.  NECK: Normal range of motion, supple, no masses.  Normal thyroid.   CARDIOVASCULAR: Normal heart rate noted, regular rhythm RESPIRATORY: Clear to auscultation bilaterally. Effort and breath sounds normal, no problems with respiration noted. BREASTS: Symmetric in size. No masses, skin changes, nipple drainage, or lymphadenopathy. ABDOMEN: Soft, normal bowel sounds, no distention noted.  No tenderness, rebound or guarding.  PELVIC: Normal appearing external genitalia; normal appearing vaginal mucosa and cervix.  No abnormal discharge noted.  Normal uterine size, no other palpable masses, no uterine or adnexal tenderness. MUSCULOSKELETAL: Normal range of motion. No tenderness.  No cyanosis, clubbing, or edema.  2+ distal pulses. SKIN: Skin is warm and dry. No rash noted. Not diaphoretic. No erythema. No pallor. NEUROLOGIC: Alert and oriented to person, place, and time. Normal reflexes, muscle tone coordination. No cranial nerve deficit noted. PSYCHIATRIC: Normal mood and affect. Normal behavior. Normal judgment and thought  content.  Assessment:  Annual gynecologic examination with pap smear   Plan:  1. Well Woman Exam Will follow up results of pap smear and manage accordingly. STD testing discussed. Patient declined testing.  She has never been sexually active. Discussed exercise and diet Calcium and Vitamin D supplementation discussed  1. Well woman exam with routine gynecological exam    Routine preventative health maintenance measures emphasized. Please refer to After Visit Summary for other counseling recommendations.    Dwana Curd. [place], [place] for [facility]

## 2020-05-06 NOTE — Patient Instructions (Signed)

## 2020-05-08 DIAGNOSIS — F3132 Bipolar disorder, current episode depressed, moderate: Secondary | ICD-10-CM | POA: Diagnosis not present

## 2020-05-08 LAB — CYTOLOGY - PAP
Diagnosis: NEGATIVE
Diagnosis: REACTIVE

## 2020-05-15 DIAGNOSIS — F3132 Bipolar disorder, current episode depressed, moderate: Secondary | ICD-10-CM | POA: Diagnosis not present

## 2020-05-20 DIAGNOSIS — E1169 Type 2 diabetes mellitus with other specified complication: Secondary | ICD-10-CM | POA: Diagnosis not present

## 2020-05-22 DIAGNOSIS — F3132 Bipolar disorder, current episode depressed, moderate: Secondary | ICD-10-CM | POA: Diagnosis not present

## 2020-05-29 DIAGNOSIS — F3132 Bipolar disorder, current episode depressed, moderate: Secondary | ICD-10-CM | POA: Diagnosis not present

## 2020-06-03 DIAGNOSIS — F429 Obsessive-compulsive disorder, unspecified: Secondary | ICD-10-CM | POA: Diagnosis not present

## 2020-06-03 DIAGNOSIS — F251 Schizoaffective disorder, depressive type: Secondary | ICD-10-CM | POA: Diagnosis not present

## 2020-06-05 DIAGNOSIS — F3132 Bipolar disorder, current episode depressed, moderate: Secondary | ICD-10-CM | POA: Diagnosis not present

## 2020-06-12 DIAGNOSIS — F3132 Bipolar disorder, current episode depressed, moderate: Secondary | ICD-10-CM | POA: Diagnosis not present

## 2020-06-19 DIAGNOSIS — F3132 Bipolar disorder, current episode depressed, moderate: Secondary | ICD-10-CM | POA: Diagnosis not present

## 2020-06-26 DIAGNOSIS — F3132 Bipolar disorder, current episode depressed, moderate: Secondary | ICD-10-CM | POA: Diagnosis not present

## 2020-07-03 DIAGNOSIS — F3132 Bipolar disorder, current episode depressed, moderate: Secondary | ICD-10-CM | POA: Diagnosis not present

## 2020-07-10 DIAGNOSIS — F3132 Bipolar disorder, current episode depressed, moderate: Secondary | ICD-10-CM | POA: Diagnosis not present

## 2020-07-17 DIAGNOSIS — F3132 Bipolar disorder, current episode depressed, moderate: Secondary | ICD-10-CM | POA: Diagnosis not present

## 2020-07-22 DIAGNOSIS — E1169 Type 2 diabetes mellitus with other specified complication: Secondary | ICD-10-CM | POA: Diagnosis not present

## 2020-07-22 DIAGNOSIS — E559 Vitamin D deficiency, unspecified: Secondary | ICD-10-CM | POA: Diagnosis not present

## 2020-07-24 DIAGNOSIS — F3132 Bipolar disorder, current episode depressed, moderate: Secondary | ICD-10-CM | POA: Diagnosis not present

## 2020-07-31 DIAGNOSIS — F3132 Bipolar disorder, current episode depressed, moderate: Secondary | ICD-10-CM | POA: Diagnosis not present

## 2020-08-07 DIAGNOSIS — F3132 Bipolar disorder, current episode depressed, moderate: Secondary | ICD-10-CM | POA: Diagnosis not present

## 2020-08-14 DIAGNOSIS — F3132 Bipolar disorder, current episode depressed, moderate: Secondary | ICD-10-CM | POA: Diagnosis not present

## 2020-08-28 DIAGNOSIS — F3132 Bipolar disorder, current episode depressed, moderate: Secondary | ICD-10-CM | POA: Diagnosis not present

## 2020-09-04 DIAGNOSIS — E782 Mixed hyperlipidemia: Secondary | ICD-10-CM | POA: Diagnosis not present

## 2020-09-04 DIAGNOSIS — E559 Vitamin D deficiency, unspecified: Secondary | ICD-10-CM | POA: Diagnosis not present

## 2020-09-04 DIAGNOSIS — E1169 Type 2 diabetes mellitus with other specified complication: Secondary | ICD-10-CM | POA: Diagnosis not present

## 2020-09-04 DIAGNOSIS — F3132 Bipolar disorder, current episode depressed, moderate: Secondary | ICD-10-CM | POA: Diagnosis not present

## 2020-09-04 DIAGNOSIS — Z0000 Encounter for general adult medical examination without abnormal findings: Secondary | ICD-10-CM | POA: Diagnosis not present

## 2020-09-11 DIAGNOSIS — F3132 Bipolar disorder, current episode depressed, moderate: Secondary | ICD-10-CM | POA: Diagnosis not present

## 2020-09-18 DIAGNOSIS — F3132 Bipolar disorder, current episode depressed, moderate: Secondary | ICD-10-CM | POA: Diagnosis not present

## 2020-09-25 DIAGNOSIS — F3132 Bipolar disorder, current episode depressed, moderate: Secondary | ICD-10-CM | POA: Diagnosis not present

## 2020-10-02 DIAGNOSIS — F3132 Bipolar disorder, current episode depressed, moderate: Secondary | ICD-10-CM | POA: Diagnosis not present

## 2020-10-16 DIAGNOSIS — F3132 Bipolar disorder, current episode depressed, moderate: Secondary | ICD-10-CM | POA: Diagnosis not present

## 2020-10-23 DIAGNOSIS — F3132 Bipolar disorder, current episode depressed, moderate: Secondary | ICD-10-CM | POA: Diagnosis not present

## 2020-10-30 DIAGNOSIS — F3132 Bipolar disorder, current episode depressed, moderate: Secondary | ICD-10-CM | POA: Diagnosis not present

## 2020-11-06 DIAGNOSIS — F3132 Bipolar disorder, current episode depressed, moderate: Secondary | ICD-10-CM | POA: Diagnosis not present

## 2020-11-13 DIAGNOSIS — F3132 Bipolar disorder, current episode depressed, moderate: Secondary | ICD-10-CM | POA: Diagnosis not present

## 2020-11-20 DIAGNOSIS — F3132 Bipolar disorder, current episode depressed, moderate: Secondary | ICD-10-CM | POA: Diagnosis not present

## 2020-11-27 DIAGNOSIS — F3132 Bipolar disorder, current episode depressed, moderate: Secondary | ICD-10-CM | POA: Diagnosis not present

## 2020-12-04 DIAGNOSIS — F3132 Bipolar disorder, current episode depressed, moderate: Secondary | ICD-10-CM | POA: Diagnosis not present

## 2020-12-11 DIAGNOSIS — F3132 Bipolar disorder, current episode depressed, moderate: Secondary | ICD-10-CM | POA: Diagnosis not present

## 2020-12-18 DIAGNOSIS — F3132 Bipolar disorder, current episode depressed, moderate: Secondary | ICD-10-CM | POA: Diagnosis not present

## 2020-12-25 DIAGNOSIS — F3132 Bipolar disorder, current episode depressed, moderate: Secondary | ICD-10-CM | POA: Diagnosis not present

## 2021-01-01 DIAGNOSIS — F3132 Bipolar disorder, current episode depressed, moderate: Secondary | ICD-10-CM | POA: Diagnosis not present

## 2021-01-08 DIAGNOSIS — F3132 Bipolar disorder, current episode depressed, moderate: Secondary | ICD-10-CM | POA: Diagnosis not present

## 2021-01-15 DIAGNOSIS — F3132 Bipolar disorder, current episode depressed, moderate: Secondary | ICD-10-CM | POA: Diagnosis not present

## 2021-01-22 DIAGNOSIS — F3132 Bipolar disorder, current episode depressed, moderate: Secondary | ICD-10-CM | POA: Diagnosis not present

## 2021-01-23 DIAGNOSIS — Z7984 Long term (current) use of oral hypoglycemic drugs: Secondary | ICD-10-CM | POA: Diagnosis not present

## 2021-01-23 DIAGNOSIS — E1169 Type 2 diabetes mellitus with other specified complication: Secondary | ICD-10-CM | POA: Diagnosis not present

## 2021-01-29 DIAGNOSIS — F3132 Bipolar disorder, current episode depressed, moderate: Secondary | ICD-10-CM | POA: Diagnosis not present

## 2021-02-05 DIAGNOSIS — F3132 Bipolar disorder, current episode depressed, moderate: Secondary | ICD-10-CM | POA: Diagnosis not present

## 2021-02-09 DIAGNOSIS — U071 COVID-19: Secondary | ICD-10-CM | POA: Diagnosis not present

## 2021-02-09 DIAGNOSIS — J029 Acute pharyngitis, unspecified: Secondary | ICD-10-CM | POA: Diagnosis not present

## 2021-02-09 HISTORY — DX: COVID-19: U07.1

## 2021-02-19 DIAGNOSIS — F3132 Bipolar disorder, current episode depressed, moderate: Secondary | ICD-10-CM | POA: Diagnosis not present

## 2021-02-26 DIAGNOSIS — F3132 Bipolar disorder, current episode depressed, moderate: Secondary | ICD-10-CM | POA: Diagnosis not present

## 2021-03-05 DIAGNOSIS — F3132 Bipolar disorder, current episode depressed, moderate: Secondary | ICD-10-CM | POA: Diagnosis not present

## 2021-03-12 DIAGNOSIS — F3132 Bipolar disorder, current episode depressed, moderate: Secondary | ICD-10-CM | POA: Diagnosis not present

## 2021-03-19 DIAGNOSIS — F3132 Bipolar disorder, current episode depressed, moderate: Secondary | ICD-10-CM | POA: Diagnosis not present

## 2021-03-26 DIAGNOSIS — F3132 Bipolar disorder, current episode depressed, moderate: Secondary | ICD-10-CM | POA: Diagnosis not present

## 2021-04-01 DIAGNOSIS — F418 Other specified anxiety disorders: Secondary | ICD-10-CM | POA: Diagnosis not present

## 2021-04-01 DIAGNOSIS — F251 Schizoaffective disorder, depressive type: Secondary | ICD-10-CM | POA: Diagnosis not present

## 2021-04-01 DIAGNOSIS — F429 Obsessive-compulsive disorder, unspecified: Secondary | ICD-10-CM | POA: Diagnosis not present

## 2021-04-02 DIAGNOSIS — F3132 Bipolar disorder, current episode depressed, moderate: Secondary | ICD-10-CM | POA: Diagnosis not present

## 2021-04-09 DIAGNOSIS — F3132 Bipolar disorder, current episode depressed, moderate: Secondary | ICD-10-CM | POA: Diagnosis not present

## 2021-04-16 DIAGNOSIS — F3132 Bipolar disorder, current episode depressed, moderate: Secondary | ICD-10-CM | POA: Diagnosis not present

## 2021-04-23 DIAGNOSIS — F3132 Bipolar disorder, current episode depressed, moderate: Secondary | ICD-10-CM | POA: Diagnosis not present

## 2021-04-30 DIAGNOSIS — F3132 Bipolar disorder, current episode depressed, moderate: Secondary | ICD-10-CM | POA: Diagnosis not present

## 2021-05-14 DIAGNOSIS — F3132 Bipolar disorder, current episode depressed, moderate: Secondary | ICD-10-CM | POA: Diagnosis not present

## 2021-05-21 DIAGNOSIS — F3132 Bipolar disorder, current episode depressed, moderate: Secondary | ICD-10-CM | POA: Diagnosis not present

## 2021-05-28 DIAGNOSIS — F3132 Bipolar disorder, current episode depressed, moderate: Secondary | ICD-10-CM | POA: Diagnosis not present

## 2021-06-04 DIAGNOSIS — F3132 Bipolar disorder, current episode depressed, moderate: Secondary | ICD-10-CM | POA: Diagnosis not present

## 2021-06-11 DIAGNOSIS — F3132 Bipolar disorder, current episode depressed, moderate: Secondary | ICD-10-CM | POA: Diagnosis not present

## 2021-06-18 DIAGNOSIS — F3132 Bipolar disorder, current episode depressed, moderate: Secondary | ICD-10-CM | POA: Diagnosis not present

## 2021-06-25 DIAGNOSIS — F3132 Bipolar disorder, current episode depressed, moderate: Secondary | ICD-10-CM | POA: Diagnosis not present

## 2021-07-02 DIAGNOSIS — F3132 Bipolar disorder, current episode depressed, moderate: Secondary | ICD-10-CM | POA: Diagnosis not present

## 2021-07-09 DIAGNOSIS — F3132 Bipolar disorder, current episode depressed, moderate: Secondary | ICD-10-CM | POA: Diagnosis not present

## 2021-07-16 DIAGNOSIS — F3132 Bipolar disorder, current episode depressed, moderate: Secondary | ICD-10-CM | POA: Diagnosis not present

## 2021-07-23 DIAGNOSIS — F3132 Bipolar disorder, current episode depressed, moderate: Secondary | ICD-10-CM | POA: Diagnosis not present

## 2021-07-30 DIAGNOSIS — F3132 Bipolar disorder, current episode depressed, moderate: Secondary | ICD-10-CM | POA: Diagnosis not present

## 2021-08-06 DIAGNOSIS — F3132 Bipolar disorder, current episode depressed, moderate: Secondary | ICD-10-CM | POA: Diagnosis not present

## 2021-08-13 DIAGNOSIS — F3132 Bipolar disorder, current episode depressed, moderate: Secondary | ICD-10-CM | POA: Diagnosis not present

## 2021-08-27 DIAGNOSIS — F3132 Bipolar disorder, current episode depressed, moderate: Secondary | ICD-10-CM | POA: Diagnosis not present

## 2021-09-03 DIAGNOSIS — F3132 Bipolar disorder, current episode depressed, moderate: Secondary | ICD-10-CM | POA: Diagnosis not present

## 2021-09-07 DIAGNOSIS — E559 Vitamin D deficiency, unspecified: Secondary | ICD-10-CM | POA: Diagnosis not present

## 2021-09-07 DIAGNOSIS — E1169 Type 2 diabetes mellitus with other specified complication: Secondary | ICD-10-CM | POA: Diagnosis not present

## 2021-09-07 DIAGNOSIS — F3341 Major depressive disorder, recurrent, in partial remission: Secondary | ICD-10-CM | POA: Diagnosis not present

## 2021-09-07 DIAGNOSIS — E782 Mixed hyperlipidemia: Secondary | ICD-10-CM | POA: Diagnosis not present

## 2021-09-10 DIAGNOSIS — F3132 Bipolar disorder, current episode depressed, moderate: Secondary | ICD-10-CM | POA: Diagnosis not present

## 2021-09-17 DIAGNOSIS — F3132 Bipolar disorder, current episode depressed, moderate: Secondary | ICD-10-CM | POA: Diagnosis not present

## 2021-09-30 DIAGNOSIS — F251 Schizoaffective disorder, depressive type: Secondary | ICD-10-CM | POA: Diagnosis not present

## 2021-10-01 DIAGNOSIS — F3132 Bipolar disorder, current episode depressed, moderate: Secondary | ICD-10-CM | POA: Diagnosis not present

## 2021-10-08 DIAGNOSIS — F3132 Bipolar disorder, current episode depressed, moderate: Secondary | ICD-10-CM | POA: Diagnosis not present

## 2021-10-15 DIAGNOSIS — F3132 Bipolar disorder, current episode depressed, moderate: Secondary | ICD-10-CM | POA: Diagnosis not present

## 2021-10-22 DIAGNOSIS — F3132 Bipolar disorder, current episode depressed, moderate: Secondary | ICD-10-CM | POA: Diagnosis not present

## 2021-11-05 DIAGNOSIS — F3132 Bipolar disorder, current episode depressed, moderate: Secondary | ICD-10-CM | POA: Diagnosis not present

## 2021-11-12 DIAGNOSIS — F3132 Bipolar disorder, current episode depressed, moderate: Secondary | ICD-10-CM | POA: Diagnosis not present

## 2021-11-19 DIAGNOSIS — F3132 Bipolar disorder, current episode depressed, moderate: Secondary | ICD-10-CM | POA: Diagnosis not present

## 2021-11-26 DIAGNOSIS — F3132 Bipolar disorder, current episode depressed, moderate: Secondary | ICD-10-CM | POA: Diagnosis not present

## 2021-12-03 DIAGNOSIS — F3132 Bipolar disorder, current episode depressed, moderate: Secondary | ICD-10-CM | POA: Diagnosis not present

## 2021-12-10 DIAGNOSIS — F3132 Bipolar disorder, current episode depressed, moderate: Secondary | ICD-10-CM | POA: Diagnosis not present

## 2021-12-17 DIAGNOSIS — F3132 Bipolar disorder, current episode depressed, moderate: Secondary | ICD-10-CM | POA: Diagnosis not present

## 2021-12-24 DIAGNOSIS — F3132 Bipolar disorder, current episode depressed, moderate: Secondary | ICD-10-CM | POA: Diagnosis not present

## 2021-12-31 DIAGNOSIS — F3132 Bipolar disorder, current episode depressed, moderate: Secondary | ICD-10-CM | POA: Diagnosis not present

## 2022-01-07 DIAGNOSIS — F3132 Bipolar disorder, current episode depressed, moderate: Secondary | ICD-10-CM | POA: Diagnosis not present

## 2022-01-14 DIAGNOSIS — F3132 Bipolar disorder, current episode depressed, moderate: Secondary | ICD-10-CM | POA: Diagnosis not present

## 2022-01-21 DIAGNOSIS — F3132 Bipolar disorder, current episode depressed, moderate: Secondary | ICD-10-CM | POA: Diagnosis not present

## 2022-01-28 DIAGNOSIS — F3132 Bipolar disorder, current episode depressed, moderate: Secondary | ICD-10-CM | POA: Diagnosis not present

## 2022-02-04 DIAGNOSIS — F3132 Bipolar disorder, current episode depressed, moderate: Secondary | ICD-10-CM | POA: Diagnosis not present

## 2022-02-11 DIAGNOSIS — F3132 Bipolar disorder, current episode depressed, moderate: Secondary | ICD-10-CM | POA: Diagnosis not present

## 2022-02-18 DIAGNOSIS — F3132 Bipolar disorder, current episode depressed, moderate: Secondary | ICD-10-CM | POA: Diagnosis not present

## 2022-02-25 DIAGNOSIS — F3132 Bipolar disorder, current episode depressed, moderate: Secondary | ICD-10-CM | POA: Diagnosis not present

## 2022-03-04 DIAGNOSIS — F3132 Bipolar disorder, current episode depressed, moderate: Secondary | ICD-10-CM | POA: Diagnosis not present

## 2022-03-11 DIAGNOSIS — F3132 Bipolar disorder, current episode depressed, moderate: Secondary | ICD-10-CM | POA: Diagnosis not present

## 2022-03-18 DIAGNOSIS — F3132 Bipolar disorder, current episode depressed, moderate: Secondary | ICD-10-CM | POA: Diagnosis not present

## 2022-03-25 DIAGNOSIS — F3132 Bipolar disorder, current episode depressed, moderate: Secondary | ICD-10-CM | POA: Diagnosis not present

## 2022-04-01 DIAGNOSIS — F3132 Bipolar disorder, current episode depressed, moderate: Secondary | ICD-10-CM | POA: Diagnosis not present

## 2022-04-08 DIAGNOSIS — F3132 Bipolar disorder, current episode depressed, moderate: Secondary | ICD-10-CM | POA: Diagnosis not present

## 2022-04-15 DIAGNOSIS — F3132 Bipolar disorder, current episode depressed, moderate: Secondary | ICD-10-CM | POA: Diagnosis not present

## 2022-04-20 DIAGNOSIS — Z23 Encounter for immunization: Secondary | ICD-10-CM | POA: Diagnosis not present

## 2022-04-20 DIAGNOSIS — E1169 Type 2 diabetes mellitus with other specified complication: Secondary | ICD-10-CM | POA: Diagnosis not present

## 2022-04-20 DIAGNOSIS — E559 Vitamin D deficiency, unspecified: Secondary | ICD-10-CM | POA: Diagnosis not present

## 2022-04-20 DIAGNOSIS — Z0000 Encounter for general adult medical examination without abnormal findings: Secondary | ICD-10-CM | POA: Diagnosis not present

## 2022-04-20 DIAGNOSIS — F3341 Major depressive disorder, recurrent, in partial remission: Secondary | ICD-10-CM | POA: Diagnosis not present

## 2022-04-20 DIAGNOSIS — E782 Mixed hyperlipidemia: Secondary | ICD-10-CM | POA: Diagnosis not present

## 2022-04-22 DIAGNOSIS — F3132 Bipolar disorder, current episode depressed, moderate: Secondary | ICD-10-CM | POA: Diagnosis not present

## 2022-04-29 DIAGNOSIS — F3132 Bipolar disorder, current episode depressed, moderate: Secondary | ICD-10-CM | POA: Diagnosis not present

## 2022-05-06 DIAGNOSIS — F3132 Bipolar disorder, current episode depressed, moderate: Secondary | ICD-10-CM | POA: Diagnosis not present

## 2022-05-13 DIAGNOSIS — F3132 Bipolar disorder, current episode depressed, moderate: Secondary | ICD-10-CM | POA: Diagnosis not present

## 2022-05-20 DIAGNOSIS — F3132 Bipolar disorder, current episode depressed, moderate: Secondary | ICD-10-CM | POA: Diagnosis not present

## 2022-05-27 DIAGNOSIS — F3132 Bipolar disorder, current episode depressed, moderate: Secondary | ICD-10-CM | POA: Diagnosis not present

## 2022-06-03 DIAGNOSIS — F3132 Bipolar disorder, current episode depressed, moderate: Secondary | ICD-10-CM | POA: Diagnosis not present

## 2022-06-08 ENCOUNTER — Ambulatory Visit (INDEPENDENT_AMBULATORY_CARE_PROVIDER_SITE_OTHER): Payer: BC Managed Care – PPO

## 2022-06-08 VITALS — BP 117/79 | HR 92 | Wt 215.0 lb

## 2022-06-08 DIAGNOSIS — N921 Excessive and frequent menstruation with irregular cycle: Secondary | ICD-10-CM

## 2022-06-08 DIAGNOSIS — Z975 Presence of (intrauterine) contraceptive device: Secondary | ICD-10-CM | POA: Diagnosis not present

## 2022-06-08 MED ORDER — TRANEXAMIC ACID 650 MG PO TABS: 1300.0000 mg | ORAL_TABLET | Freq: Three times a day (TID) | ORAL | 2 refills | Status: DC

## 2022-06-08 NOTE — Progress Notes (Signed)
Pt states she has been having abnormal bleeding with Nexplanon the last few months.  Pt states heavy and may bleed up to 2 weeks.  Pt is not, never been sexually.

## 2022-06-09 LAB — HEMOGLOBIN AND HEMATOCRIT, BLOOD
Hematocrit: 39.8 % (ref 34.0–46.6)
Hemoglobin: 13.4 g/dL (ref 11.1–15.9)

## 2022-06-10 DIAGNOSIS — F3132 Bipolar disorder, current episode depressed, moderate: Secondary | ICD-10-CM | POA: Diagnosis not present

## 2022-06-12 NOTE — Progress Notes (Signed)
GYNECOLOGY PROBLEM OFFICE VISIT NOTE  History:  Jillian [name] is a 26 y.o. African American G0P here today for DUB in setting of Nexplanon. She is accompanied by her mother, Jillian [name], who is supportive and also contributes to the history.  Patient confirms that her Nexplanon was inserted in June 2021 after removal of her [place] IUD.  She states she has been having cycles that "varies" in flow and does not correlate with activity.  She states she uses "heavy duty pads" on her heaviest days.  She also reports loss of appetite in the past 3 days with onset of nausea.  Patient states she does not desire children and would like to have a hysterectomy.  She reports the unpredictable bleeding has become a disruption to her daily life. Patient also reports that she has daily bowel movements, while on her menses, and finds this to be of concern. She reports her usual bowel regimen is twice a week.   Past Medical History:  Diagnosis Date   Anemia    Anxiety    Borderline glaucoma    Depression    Hypoglycemia    OCD (obsessive compulsive disorder)     Past Surgical History:  Procedure Laterality Date   DENTAL SURGERY     WISDOM TOOTH EXTRACTION Bilateral     The following portions of the patient's history were reviewed and updated as appropriate: allergies, current medications, past family history, past medical history, past social history, past surgical history and problem list.   Health Maintenance:  Normal pap and negative HRHPV on August 2021.  No mammogram on file d/t age.   Review of Systems:  Genito-Urinary ROS: no dysuria, trouble voiding, or hematuria Gastrointestinal ROS: negative Objective:  Vitals: BP 117/79   Pulse 92   Wt 215 lb (97.5 kg)   BMI 36.90 kg/m   Physical Exam: Physical Exam Constitutional:      Appearance: Normal appearance.  HENT:     Head: Normocephalic and atraumatic.  Eyes:     Conjunctiva/sclera: Conjunctivae normal.  Cardiovascular:     Rate and  Rhythm: Normal rate.  Pulmonary:     Effort: Pulmonary effort is normal. No respiratory distress.     Breath sounds: Normal breath sounds.  Musculoskeletal:        General: Normal range of motion.     Cervical back: Normal range of motion.  Neurological:     Mental Status: She is alert and oriented to person, place, and time.  Psychiatric:        Mood and Affect: Mood normal.        Behavior: Behavior normal.  Vitals reviewed.      Labs and Imaging: No results found.  Assessment & Plan:  26 year old Breakthrough Bleeding  Nexplanon in Place   -Provider gives support for patient's reproductive autonomy regarding this desire and decision to not have children. Extensive discussion regarding desire for hysterectomy, but with disclosure that this provider is not a surgeon! -Cautioned that many surgeons may also respect her desires, but may not perform a hysterectomy in the absence of uterine abnormalities especially in setting of Nulliparity. -Discussed obtaining ultrasound to r/o potential structural causes and/or growths that could be contributing to heavy and dysfunctional bleeding including adenomyosis and fibroids.  -Patient agreeable. [place] ordered. Cautioned that transvaginal probe ordered, but can be declined. -Support given for vaginal bleeding. Discussed usage of medications to help slow if not stop bleeding. Reviewed risks and potential side effects of TXA  oral medications. -Patient opts to try and if side effects prove too much will contact office for modification. -Discussed obtaining H&H to evaluated levels and prescribe iron supplement if necessary. -Discussed bowel regimen of twice weekly and informed that this may be a form of constipation. Encouraged increased fiber, water intake, and OTC medications as necessary.  -Patient agreeable to POC and without questions. -Will modify plan based on result findings.  -Consult with L.Leftwich-Kirby, CNM who states Megace is also an  option that can be prescribed if patient not tolerating TXA and can be taken indefinitely.    Total face-to-face time with patient: 25 minutes   Gavin Pound, [place] 06/08/2022 1:56 PM

## 2022-06-17 DIAGNOSIS — F3132 Bipolar disorder, current episode depressed, moderate: Secondary | ICD-10-CM | POA: Diagnosis not present

## 2022-06-18 ENCOUNTER — Ambulatory Visit (HOSPITAL_BASED_OUTPATIENT_CLINIC_OR_DEPARTMENT_OTHER)
Admission: RE | Admit: 2022-06-18 | Discharge: 2022-06-18 | Disposition: A | Discharge status: Home / Self Care | Payer: BC Managed Care – PPO | Source: Ambulatory Visit

## 2022-06-18 DIAGNOSIS — Z975 Presence of (intrauterine) contraceptive device: Secondary | ICD-10-CM | POA: Insufficient documentation

## 2022-06-18 DIAGNOSIS — N920 Excessive and frequent menstruation with regular cycle: Secondary | ICD-10-CM | POA: Diagnosis not present

## 2022-06-18 DIAGNOSIS — N921 Excessive and frequent menstruation with irregular cycle: Secondary | ICD-10-CM | POA: Diagnosis not present

## 2022-06-24 DIAGNOSIS — F3132 Bipolar disorder, current episode depressed, moderate: Secondary | ICD-10-CM | POA: Diagnosis not present

## 2022-06-30 DIAGNOSIS — F429 Obsessive-compulsive disorder, unspecified: Secondary | ICD-10-CM | POA: Diagnosis not present

## 2022-06-30 DIAGNOSIS — F251 Schizoaffective disorder, depressive type: Secondary | ICD-10-CM | POA: Diagnosis not present

## 2022-07-01 DIAGNOSIS — F3132 Bipolar disorder, current episode depressed, moderate: Secondary | ICD-10-CM | POA: Diagnosis not present

## 2022-07-08 DIAGNOSIS — F3132 Bipolar disorder, current episode depressed, moderate: Secondary | ICD-10-CM | POA: Diagnosis not present

## 2022-07-15 DIAGNOSIS — F3132 Bipolar disorder, current episode depressed, moderate: Secondary | ICD-10-CM | POA: Diagnosis not present

## 2022-07-22 DIAGNOSIS — F3132 Bipolar disorder, current episode depressed, moderate: Secondary | ICD-10-CM | POA: Diagnosis not present

## 2022-07-23 ENCOUNTER — Ambulatory Visit (INDEPENDENT_AMBULATORY_CARE_PROVIDER_SITE_OTHER): Payer: BC Managed Care – PPO | Admitting: Obstetrics and Gynecology

## 2022-07-23 VITALS — BP 112/74 | HR 89 | Wt 217.0 lb

## 2022-07-23 DIAGNOSIS — N939 Abnormal uterine and vaginal bleeding, unspecified: Secondary | ICD-10-CM | POA: Diagnosis not present

## 2022-07-23 NOTE — Progress Notes (Signed)
GYNECOLOGY VISIT  Patient name: Jillian [name] MRN [id number]  Date of birth: [birth year] Chief Complaint:   Follow-up AUB  History:  Jillian [name] is a 26 y.o. G0P0000 being seen today for discussion of AUB management.  Patient reports having tried several different methods to manage bleeding, but none have been successful.  Has had heavy menses since the age of 76.  She has been trying to get a hysterectomy for years.  She reports having heavy bleeding, prolonged bleeding, significant nausea, and decreased appetite during her menses.  She reports she is certain that she does not want to bear children.  She also reports being asexual, and therefore has no desire for sexual intercourse with any partners.   Current nexplanon + lysteda,  tried IUD (strings poking), prior OCPs  Past Medical History:  Diagnosis Date   Anemia    Anxiety    Borderline glaucoma    Depression    Hypoglycemia    OCD (obsessive compulsive disorder)     Past Surgical History:  Procedure Laterality Date   DENTAL SURGERY     WISDOM TOOTH EXTRACTION Bilateral     The following portions of the patient's history were reviewed and updated as appropriate: allergies, current medications, past family history, past medical history, past social history, past surgical history and problem list.   Health Maintenance:   Last pap 2021. Results were: NILM w/ HRHPV negative. H/O abnormal pap: no Last mammogram: n/a.    Review of Systems:  Pertinent items are noted in HPI. Comprehensive review of systems was otherwise negative.   Objective:  Physical Exam BP 112/74   Pulse 89   Wt 217 lb (98.4 kg)   LMP 06/27/2022   BMI 37.25 kg/m    Physical Exam Vitals and nursing note reviewed.  Constitutional:      Appearance: Normal appearance.  HENT:     Head: Normocephalic and atraumatic.  Cardiovascular:     Rate and Rhythm: Normal rate and regular rhythm.  Pulmonary:     Effort: Pulmonary effort is normal.      Breath sounds: Normal breath sounds.  Skin:    General: Skin is warm and dry.  Neurological:     General: No focal deficit present.     Mental Status: She is alert.  Psychiatric:        Mood and Affect: Mood normal.        Behavior: Behavior normal.        Thought Content: Thought content normal.        Judgment: Judgment normal.      Labs and Imaging No results found.     Assessment & Plan:  1. Abnormal uterine bleeding (AUB) Had a thorough discussion regarding various management options for AUB.  Discussed medical and surgical options, including contraceptives, GnRH agonist and antagonist, endometrial ablation, [place], and hysterectomy.  Reviewed postoperative expectations for procedures.  Patient currently works in a [place], and is able to take some time for work - occupation primarily composed of desk work and does not have to do heavy lifting.   Return for preop EMB and pelvic exam.   Patient desires surgical management with RA-TLH, BS, cysto, and nexplanon removal.  The risks of surgery were discussed in detail with the patient including but not limited to: bleeding which may require transfusion or reoperation; infection which may require prolonged hospitalization or re-hospitalization and antibiotic therapy; injury to bowel, bladder, ureters and major vessels or other surrounding  organs which may lead to other procedures; formation of adhesions; need for additional procedures including laparotomy or subsequent procedures secondary to intraoperative injury or abnormal pathology; thromboembolic phenomenon; incisional problems and other postoperative or anesthesia complications.  Patient was told that the likelihood that her condition and symptoms will be treated effectively with this surgical management was very high; the postoperative expectations were also discussed in detail. The patient also understands the alternative treatment options which were discussed in full. All  questions were answered.  She was told that she will be contacted by our surgical scheduler regarding the time and date of her surgery; routine preoperative instructions will be given to her by the preoperative nursing team.  Printed patient education handouts about the procedure were given to the patient to review at home.   Routine preventative health maintenance measures emphasized.  Darliss Cheney, MD [facility] [place]

## 2022-07-29 DIAGNOSIS — F3132 Bipolar disorder, current episode depressed, moderate: Secondary | ICD-10-CM | POA: Diagnosis not present

## 2022-08-05 DIAGNOSIS — F3132 Bipolar disorder, current episode depressed, moderate: Secondary | ICD-10-CM | POA: Diagnosis not present

## 2022-08-12 DIAGNOSIS — F3132 Bipolar disorder, current episode depressed, moderate: Secondary | ICD-10-CM | POA: Diagnosis not present

## 2022-08-19 ENCOUNTER — Encounter: Payer: Self-pay | Admitting: Obstetrics and Gynecology

## 2022-08-23 DIAGNOSIS — J029 Acute pharyngitis, unspecified: Secondary | ICD-10-CM | POA: Diagnosis not present

## 2022-08-23 DIAGNOSIS — R059 Cough, unspecified: Secondary | ICD-10-CM | POA: Diagnosis not present

## 2022-08-23 DIAGNOSIS — U071 COVID-19: Secondary | ICD-10-CM | POA: Diagnosis not present

## 2022-08-26 DIAGNOSIS — F3132 Bipolar disorder, current episode depressed, moderate: Secondary | ICD-10-CM | POA: Diagnosis not present

## 2022-08-27 ENCOUNTER — Other Ambulatory Visit: Payer: BC Managed Care – PPO | Admitting: Obstetrics and Gynecology

## 2022-09-02 DIAGNOSIS — F3132 Bipolar disorder, current episode depressed, moderate: Secondary | ICD-10-CM | POA: Diagnosis not present

## 2022-09-09 DIAGNOSIS — F3132 Bipolar disorder, current episode depressed, moderate: Secondary | ICD-10-CM | POA: Diagnosis not present

## 2022-09-14 ENCOUNTER — Encounter: Payer: Self-pay | Admitting: Obstetrics and Gynecology

## 2022-09-14 ENCOUNTER — Ambulatory Visit (INDEPENDENT_AMBULATORY_CARE_PROVIDER_SITE_OTHER): Payer: BC Managed Care – PPO | Admitting: Obstetrics and Gynecology

## 2022-09-14 ENCOUNTER — Other Ambulatory Visit (HOSPITAL_COMMUNITY)
Admission: RE | Admit: 2022-09-14 | Discharge: 2022-09-14 | Disposition: A | Discharge status: Home / Self Care | Payer: BC Managed Care – PPO | Source: Ambulatory Visit | Attending: Internal Medicine | Admitting: Internal Medicine

## 2022-09-14 VITALS — Wt 216.3 lb

## 2022-09-14 DIAGNOSIS — N858 Other specified noninflammatory disorders of uterus: Secondary | ICD-10-CM | POA: Diagnosis not present

## 2022-09-14 DIAGNOSIS — N939 Abnormal uterine and vaginal bleeding, unspecified: Secondary | ICD-10-CM | POA: Diagnosis not present

## 2022-09-14 LAB — POCT URINE PREGNANCY: Preg Test, Ur: NEGATIVE

## 2022-09-14 NOTE — Progress Notes (Signed)
Pt presents for EBx d/t AUB Robotic assisted hysterectomy scheduled 09/28/22

## 2022-09-14 NOTE — Progress Notes (Signed)
GYNECOLOGY VISIT  Patient name: Jillian [name] MRN [id number]  Date of birth: [birth year] Chief Complaint:   Biopsy  History:  Jillian [name] is a 26 y.o. G0P0000 being seen today for EMB and preop exam. Recently started menses. She is sure she would like to continue with a hysterectomy for management of bleeding and pain. Mom will be support person postop.   Past Medical History:  Diagnosis Date   Anemia    Anxiety    Borderline glaucoma    Depression    Hypoglycemia    OCD (obsessive compulsive disorder)     Past Surgical History:  Procedure Laterality Date   DENTAL SURGERY     WISDOM TOOTH EXTRACTION Bilateral     The following portions of the patient's history were reviewed and updated as appropriate: allergies, current medications, past family history, past medical history, past social history, past surgical history and problem list.   Health Maintenance:   Last pap 04/2020. Results were: NILM w/ HRHPV not done. H/O abnormal pap: no Last mammogram: n/a   Review of Systems:  Pertinent items are noted in HPI. Comprehensive review of systems was otherwise negative.   Objective:  Physical Exam Wt 216 lb 4.8 oz (98.1 kg)   LMP 09/07/2022   BMI 37.13 kg/m    Physical Exam Vitals and nursing note reviewed. Exam conducted with a chaperone present.  Constitutional:      Appearance: Normal appearance.  HENT:     Head: Normocephalic and atraumatic.  Cardiovascular:     Rate and Rhythm: Normal rate and regular rhythm.  Pulmonary:     Effort: Pulmonary effort is normal.     Breath sounds: Normal breath sounds.  Abdominal:     Palpations: Abdomen is soft.  Genitourinary:    General: Normal vulva.     Exam position: Lithotomy position.     Comments: Normal cervix 1.5 fingerbreadths on either side of cervix Mobile uterus Nontender levator ani  Skin:    General: Skin is warm and dry.  Neurological:     General: No focal deficit present.     Mental Status: She is  alert. Mental status is at baseline.  Psychiatric:        Mood and Affect: Mood normal.        Behavior: Behavior normal.        Thought Content: Thought content normal.        Judgment: Judgment normal.    Endometrial Biopsy Procedure  Patient identified, informed consent performed,  indication reviewed, consent signed.  Reviewed risk of perforation, pain, bleeding, insufficient sample, etc were reviewd. Time out was performed.  Urine pregnancy test negative.  Speculum placed in the vagina.  Cervix visualized.  Cleaned with Betadine x 2.   Paracervical block was administered and the endocervical canal instilled with using 1% lidocaine.  Endometrial pipelle was used to draw up 1cc of 1% lidocaine, introduced into the cervical os and instilled into the endometrial cavity.  The pipelle was used to sound the uterus to 9cm,  passed twice without difficulty and sample obtained.  Patient tolerated procedure well.  Patient was given post-procedure instructions.   Labs and Imaging No results found.     Assessment & Plan:   1. Abnormal uterine bleeding (AUB) Now s/p uncomplicated EMB. Reviewed procedure once again.  - POCT urine pregnancy - Surgical pathology( [facility]/ [facility])  Postpop pain: APAP/ibuprofen/oxy, bowel regiment Has social support Work papers turned into office - plans  returns end of Jan Reviewed postop precautions  Patient desires surgical management with laparoscopic hysterectomy, bilateral salpingectomy, cystoscopy and nexplanon removal.  The risks of surgery were discussed in detail with the patient including but not limited to: bleeding which may require transfusion or reoperation; infection which may require prolonged hospitalization or re-hospitalization and antibiotic therapy; injury to bowel, bladder, ureters and major vessels or other surrounding organs which may lead to other procedures; formation of adhesions; need for additional procedures including laparotomy  or subsequent procedures secondary to intraoperative injury or abnormal pathology; thromboembolic phenomenon; incisional problems and other postoperative or anesthesia complications.  Patient was told that the likelihood that her condition and symptoms will be treated effectively with this surgical management was very high; the postoperative expectations were also discussed in detail. The patient also understands the alternative treatment options which were discussed in full. All questions were answered.  She was told that she will be contacted by our surgical scheduler regarding the time and date of her surgery; routine preoperative instructions will be given to her by the preoperative nursing team.  Printed patient education handouts about the procedure were given to the patient to review at home.   Routine preventative health maintenance measures emphasized.  Darliss Cheney, MD [facility] [place]

## 2022-09-14 NOTE — H&P (View-Only) (Signed)
GYNECOLOGY VISIT  Patient name: Jillian [name] MRN [id number]  Date of birth: Aug 21, 1996 Chief Complaint:   Biopsy  History:  Jillian [name] is a 26 y.o. G0P0000 being seen today for EMB and preop exam. Recently started menses. She is sure she would like to continue with a hysterectomy for management of bleeding and pain. Mom will be support person postop.   Past Medical History:  Diagnosis Date   Anemia    Anxiety    Borderline glaucoma    Depression    Hypoglycemia    OCD (obsessive compulsive disorder)     Past Surgical History:  Procedure Laterality Date   DENTAL SURGERY     WISDOM TOOTH EXTRACTION Bilateral     The following portions of the patient's history were reviewed and updated as appropriate: allergies, current medications, past family history, past medical history, past social history, past surgical history and problem list.   Health Maintenance:   Last pap 04/2020. Results were: NILM w/ HRHPV not done. H/O abnormal pap: no Last mammogram: n/a   Review of Systems:  Pertinent items are noted in HPI. Comprehensive review of systems was otherwise negative.   Objective:  Physical Exam Wt 216 lb 4.8 oz (98.1 kg)   LMP 09/07/2022   BMI 37.13 kg/m    Physical Exam Vitals and nursing note reviewed. Exam conducted with a chaperone present.  Constitutional:      Appearance: Normal appearance.  HENT:     Head: Normocephalic and atraumatic.  Cardiovascular:     Rate and Rhythm: Normal rate and regular rhythm.  Pulmonary:     Effort: Pulmonary effort is normal.     Breath sounds: Normal breath sounds.  Abdominal:     Palpations: Abdomen is soft.  Genitourinary:    General: Normal vulva.     Exam position: Lithotomy position.     Comments: Normal cervix 1.5 fingerbreadths on either side of cervix Mobile uterus Nontender levator ani  Skin:    General: Skin is warm and dry.  Neurological:     General: No focal deficit present.     Mental Status: She is  alert. Mental status is at baseline.  Psychiatric:        Mood and Affect: Mood normal.        Behavior: Behavior normal.        Thought Content: Thought content normal.        Judgment: Judgment normal.    Endometrial Biopsy Procedure  Patient identified, informed consent performed,  indication reviewed, consent signed.  Reviewed risk of perforation, pain, bleeding, insufficient sample, etc were reviewd. Time out was performed.  Urine pregnancy test negative.  Speculum placed in the vagina.  Cervix visualized.  Cleaned with Betadine x 2.   Paracervical block was administered and the endocervical canal instilled with using 1% lidocaine.  Endometrial pipelle was used to draw up 1cc of 1% lidocaine, introduced into the cervical os and instilled into the endometrial cavity.  The pipelle was used to sound the uterus to 9cm,  passed twice without difficulty and sample obtained.  Patient tolerated procedure well.  Patient was given post-procedure instructions.   Labs and Imaging No results found.     Assessment & Plan:   1. Abnormal uterine bleeding (AUB) Now s/p uncomplicated EMB. Reviewed procedure once again.  - POCT urine pregnancy - Surgical pathology( [facility]/ [facility])  Postpop pain: APAP/ibuprofen/oxy, bowel regiment Has social support Work papers turned into office - plans  returns end of Jan Reviewed postop precautions  Patient desires surgical management with laparoscopic hysterectomy, bilateral salpingectomy, cystoscopy and nexplanon removal.  The risks of surgery were discussed in detail with the patient including but not limited to: bleeding which may require transfusion or reoperation; infection which may require prolonged hospitalization or re-hospitalization and antibiotic therapy; injury to bowel, bladder, ureters and major vessels or other surrounding organs which may lead to other procedures; formation of adhesions; need for additional procedures including laparotomy  or subsequent procedures secondary to intraoperative injury or abnormal pathology; thromboembolic phenomenon; incisional problems and other postoperative or anesthesia complications.  Patient was told that the likelihood that her condition and symptoms will be treated effectively with this surgical management was very high; the postoperative expectations were also discussed in detail. The patient also understands the alternative treatment options which were discussed in full. All questions were answered.  She was told that she will be contacted by our surgical scheduler regarding the time and date of her surgery; routine preoperative instructions will be given to her by the preoperative nursing team.  Printed patient education handouts about the procedure were given to the patient to review at home.   Routine preventative health maintenance measures emphasized.  Darliss Cheney, MD [facility]

## 2022-09-15 ENCOUNTER — Encounter (HOSPITAL_BASED_OUTPATIENT_CLINIC_OR_DEPARTMENT_OTHER): Payer: Self-pay | Admitting: Obstetrics and Gynecology

## 2022-09-15 ENCOUNTER — Other Ambulatory Visit: Payer: Self-pay

## 2022-09-15 NOTE — Progress Notes (Addendum)
Spoke w/ via phone for pre-op interview---Jillian [name] needs dos---- urine pregnancy              [name] results------09/23/22 [name] appt for cbc, bmp, EKG, type & screen COVID test -----patient states asymptomatic no test needed Arrive at -------0730 on Tuesday, 09/28/2022 NPO after MN NO Solid Food.  Clear liquids from MN until---0630 Med rec completed Medications to take morning of surgery -----Abilify, Lipitor, Effexor Diabetic medication -----Hold Metformin on the morning of surgery. Hold Wegovy one week before surgery. (Patient is currently not taking because [name] is out of stock.) Patient instructed no nail polish to be worn day of surgery Patient instructed to bring photo id and insurance card day of surgery Patient aware to have Driver (ride ) / caregiver    for 24 hours after surgery - mother, Jillian [name] Patient Special Instructions -----Extended / overnight stay instructions given. Pre-Op special Istructions -----none Patient verbalized understanding of instructions that were given at this phone interview. Patient denies shortness of breath, chest pain, fever, cough at this phone interview.

## 2022-09-15 NOTE — Progress Notes (Signed)
Your procedure is scheduled on Tuesday, 09/28/2022.  Report to [facility] AT 7:30 A. M.   Call this number if you have problems the morning of surgery  :[phone].   OUR ADDRESS IS [place].  WE ARE LOCATED IN [facility].  PLEASE BRING YOUR INSURANCE CARD AND PHOTO ID DAY OF SURGERY.  ONLY 2 PEOPLE ARE ALLOWED IN  WAITING  ROOM.                                      REMEMBER:  DO NOT EAT FOOD, CANDY GUM OR MINTS  AFTER MIDNIGHT THE NIGHT BEFORE YOUR SURGERY . YOU MAY HAVE CLEAR LIQUIDS FROM MIDNIGHT THE NIGHT BEFORE YOUR SURGERY UNTIL 6:30 AM NO CLEAR LIQUIDS AFTER   6:30 AM DAY OF SURGERY.  YOU MAY  BRUSH YOUR TEETH MORNING OF SURGERY AND RINSE YOUR MOUTH OUT, NO CHEWING GUM CANDY OR MINTS.     CLEAR LIQUID DIET   Foods Allowed                                                                     Foods Excluded  Coffee and tea, regular and decaf                             liquids that you cannot  Plain Jell-O                                                                   see through such as: Fruit ices (not with fruit pulp)                                     milk, soups, orange juice  Plain  Popsicles                                    All solid food Carbonated beverages, regular and diet                                    Cranberry, grape and apple juices Sports drinks like Gatorade _____________________________________________________________________     TAKE ONLY THESE MEDICATIONS MORNING OF SURGERY: Abilify, Lipitor, Effexor DO NOT TAKE METFORMIN ON THE MORNING OF SURGERY. SHOULD WEGOVY BECOME AVAILABLE, DO NOT TAKE FOR 7 DAYS PRIOR TO SURGERY.    UP TO 4 VISITORS  MAY VISIT IN THE EXTENDED RECOVERY ROOM UNTIL 800 PM ONLY.  ONE  VISITOR AGE 70 AND OVER MAY SPEND THE NIGHT AND MUST BE IN EXTENDED RECOVERY ROOM NO LATER THAN 800 PM . YOUR DISCHARGE TIME AFTER YOU SPEND THE NIGHT  IS 900 AM THE MORNING AFTER YOUR  SURGERY.  YOU MAY PACK A SMALL OVERNIGHT BAG WITH TOILETRIES FOR YOUR OVERNIGHT STAY IF YOU WISH.  YOUR PRESCRIPTION MEDICATIONS WILL BE PROVIDED DURING [facility].                                      DO NOT WEAR JEWERLY, MAKE UP. DO NOT WEAR LOTIONS, POWDERS, PERFUMES OR NAIL POLISH ON YOUR FINGERNAILS. TOENAIL POLISH IS OK TO WEAR. DO NOT SHAVE FOR 48 HOURS PRIOR TO DAY OF SURGERY. MEN MAY SHAVE FACE AND NECK. CONTACTS, GLASSES, OR DENTURES MAY NOT BE WORN TO SURGERY.  REMEMBER: NO SMOKING, DRUGS OR ALCOHOL FOR 24 HOURS BEFORE YOUR SURGERY.                                    [facility] IS NOT RESPONSIBLE  FOR ANY BELONGINGS.                                                                    Marland Kitchen            - Preparing for Surgery Before surgery, you can play an important role.  Because skin is not sterile, your skin needs to be as free of germs as possible.  You can reduce the number of germs on your skin by washing with CHG (chlorahexidine gluconate) soap before surgery.  CHG is an antiseptic cleaner which kills germs and bonds with the skin to continue killing germs even after washing. Please DO NOT use if you have an allergy to CHG or antibacterial soaps.  If your skin becomes reddened/irritated stop using the CHG and inform your nurse when you arrive at Short Stay. Do not shave (including legs and underarms) for at least 48 hours prior to the first CHG shower.  You may shave your face/neck. Please follow these instructions carefully:  1.  Shower with CHG Soap the night before surgery and the  morning of Surgery.  2.  If you choose to wash your hair, wash your hair first as usual with your  normal  shampoo.  3.  After you shampoo, rinse your hair and body thoroughly to remove the  shampoo.                                        4.  Use CHG as you would any other liquid soap.  You can apply chg directly  to the skin and wash , chg soap provided, night before and  morning of your surgery.  5.  Apply the CHG Soap to your body ONLY FROM THE NECK DOWN.   Do not use on face/ open                           Wound or open sores. Avoid contact with eyes, ears mouth and genitals (private parts).  Wash face,  Genitals (private parts) with your normal soap.             6.  Wash thoroughly, paying special attention to the area where your surgery  will be performed.  7.  Thoroughly rinse your body with warm water from the neck down.  8.  DO NOT shower/wash with your normal soap after using and rinsing off  the CHG Soap.             9.  Pat yourself dry with a clean towel.            10.  Wear clean pajamas.            11.  Place clean sheets on your bed the night of your first shower and do not  sleep with pets. Day of Surgery : Do not apply any lotions/deodorants the morning of surgery.  Please wear clean clothes to the hospital/surgery center.  IF YOU HAVE ANY SKIN IRRITATION OR PROBLEMS WITH THE SURGICAL SOAP, PLEASE GET A BAR OF GOLD DIAL SOAP AND SHOWER THE NIGHT BEFORE YOUR SURGERY AND THE MORNING OF YOUR SURGERY. PLEASE LET THE NURSE KNOW MORNING OF YOUR SURGERY IF YOU HAD ANY PROBLEMS WITH THE SURGICAL SOAP.   ________________________________________________________________________                                                        QUESTIONS [facility] PRE OP NURSE PHONE [phone].

## 2022-09-16 DIAGNOSIS — F3132 Bipolar disorder, current episode depressed, moderate: Secondary | ICD-10-CM | POA: Diagnosis not present

## 2022-09-16 LAB — SURGICAL PATHOLOGY

## 2022-09-23 ENCOUNTER — Encounter (HOSPITAL_COMMUNITY)
Admission: RE | Admit: 2022-09-23 | Discharge: 2022-09-23 | Disposition: A | Discharge status: Home / Self Care | Payer: BC Managed Care – PPO | Source: Ambulatory Visit | Attending: Obstetrics and Gynecology | Admitting: Obstetrics and Gynecology

## 2022-09-23 DIAGNOSIS — Z01810 Encounter for preprocedural cardiovascular examination: Secondary | ICD-10-CM | POA: Diagnosis not present

## 2022-09-23 DIAGNOSIS — Z01818 Encounter for other preprocedural examination: Secondary | ICD-10-CM | POA: Insufficient documentation

## 2022-09-23 DIAGNOSIS — F3132 Bipolar disorder, current episode depressed, moderate: Secondary | ICD-10-CM | POA: Diagnosis not present

## 2022-09-23 LAB — BASIC METABOLIC PANEL
Anion gap: 7 (ref 5–15)
BUN: 8 mg/dL (ref 6–20)
CO2: 25 mmol/L (ref 22–32)
Calcium: 9 mg/dL (ref 8.9–10.3)
Chloride: 105 mmol/L (ref 98–111)
Creatinine, Ser: 0.67 mg/dL (ref 0.44–1.00)
GFR, Estimated: >60 mL/min (ref >60)
Glucose, Bld: 93 mg/dL (ref 70–99)
Potassium: 3.8 mmol/L (ref 3.5–5.1)
Sodium: 137 mmol/L (ref 135–145)

## 2022-09-23 LAB — CBC
HCT: 41.2 % (ref 36.0–46.0)
Hemoglobin: 13 g/dL (ref 12.0–15.0)
MCH: 27.4 pg (ref 26.0–34.0)
MCHC: 31.6 g/dL (ref 30.0–36.0)
MCV: 86.7 fL (ref 80.0–100.0)
Platelets: 414 10*3/uL — ABNORMAL HIGH (ref 150–400)
RBC: 4.75 MIL/uL (ref 3.87–5.11)
RDW: 13.8 % (ref 11.5–15.5)
WBC: 11.3 10*3/uL — ABNORMAL HIGH (ref 4.0–10.5)
nRBC: 0 % (ref 0.0–0.2)

## 2022-09-28 ENCOUNTER — Other Ambulatory Visit: Payer: Self-pay

## 2022-09-28 ENCOUNTER — Encounter (HOSPITAL_BASED_OUTPATIENT_CLINIC_OR_DEPARTMENT_OTHER)
Admission: RE | Disposition: A | Discharge status: Home / Self Care | Payer: Self-pay | Source: Home / Self Care | Attending: Obstetrics and Gynecology

## 2022-09-28 ENCOUNTER — Ambulatory Visit (HOSPITAL_BASED_OUTPATIENT_CLINIC_OR_DEPARTMENT_OTHER): Payer: BC Managed Care – PPO | Admitting: Anesthesiology

## 2022-09-28 ENCOUNTER — Encounter (HOSPITAL_BASED_OUTPATIENT_CLINIC_OR_DEPARTMENT_OTHER): Payer: Self-pay | Admitting: Obstetrics and Gynecology

## 2022-09-28 ENCOUNTER — Ambulatory Visit (HOSPITAL_COMMUNITY)
Admission: RE | Admit: 2022-09-28 | Discharge: 2022-09-28 | Disposition: A | Discharge status: Home / Self Care | Payer: BC Managed Care – PPO | Attending: Obstetrics and Gynecology | Admitting: Obstetrics and Gynecology

## 2022-09-28 DIAGNOSIS — N939 Abnormal uterine and vaginal bleeding, unspecified: Secondary | ICD-10-CM

## 2022-09-28 DIAGNOSIS — N938 Other specified abnormal uterine and vaginal bleeding: Secondary | ICD-10-CM | POA: Diagnosis not present

## 2022-09-28 DIAGNOSIS — R519 Headache, unspecified: Secondary | ICD-10-CM | POA: Diagnosis not present

## 2022-09-28 DIAGNOSIS — E119 Type 2 diabetes mellitus without complications: Secondary | ICD-10-CM | POA: Diagnosis not present

## 2022-09-28 DIAGNOSIS — D259 Leiomyoma of uterus, unspecified: Secondary | ICD-10-CM | POA: Diagnosis not present

## 2022-09-28 DIAGNOSIS — N858 Other specified noninflammatory disorders of uterus: Secondary | ICD-10-CM | POA: Diagnosis not present

## 2022-09-28 DIAGNOSIS — Z3046 Encounter for surveillance of implantable subdermal contraceptive: Secondary | ICD-10-CM | POA: Diagnosis not present

## 2022-09-28 DIAGNOSIS — Z7984 Long term (current) use of oral hypoglycemic drugs: Secondary | ICD-10-CM | POA: Insufficient documentation

## 2022-09-28 DIAGNOSIS — Z01818 Encounter for other preprocedural examination: Secondary | ICD-10-CM

## 2022-09-28 DIAGNOSIS — N946 Dysmenorrhea, unspecified: Secondary | ICD-10-CM | POA: Diagnosis not present

## 2022-09-28 HISTORY — PX: TOTAL LAPAROSCOPIC HYSTERECTOMY WITH SALPINGECTOMY: SHX6742

## 2022-09-28 HISTORY — DX: Abnormal uterine and vaginal bleeding, unspecified: N93.9

## 2022-09-28 HISTORY — PX: REMOVAL OF DRUG DELIVERY IMPLANT: SHX6585

## 2022-09-28 HISTORY — DX: Type 2 diabetes mellitus without complications: E11.9

## 2022-09-28 HISTORY — PX: CYSTOSCOPY: SHX5120

## 2022-09-28 HISTORY — DX: Presence of spectacles and contact lenses: Z97.3

## 2022-09-28 LAB — TYPE AND SCREEN
ABO/RH(D): B POS
Antibody Screen: NEGATIVE

## 2022-09-28 LAB — ABO/RH: ABO/RH(D): B POS

## 2022-09-28 LAB — GLUCOSE, CAPILLARY
Glucose-Capillary: 103 mg/dL — ABNORMAL HIGH (ref 70–99)
Glucose-Capillary: 147 mg/dL — ABNORMAL HIGH (ref 70–99)

## 2022-09-28 LAB — POCT PREGNANCY, URINE: Preg Test, Ur: NEGATIVE

## 2022-09-28 SURGERY — XI ROBOTIC ASSISTED LAPAROSCOPIC HYSTERECTOMY AND SALPINGECTOMY
Anesthesia: Choice

## 2022-09-28 SURGERY — HYSTERECTOMY, TOTAL, LAPAROSCOPIC, WITH SALPINGECTOMY
Anesthesia: General | Site: Pelvis

## 2022-09-28 MED ORDER — LIDOCAINE 2% (20 MG/ML) 5 ML SYRINGE
INTRAMUSCULAR | Status: DC | PRN
  Administered 2022-09-28: 50 mg via INTRAVENOUS

## 2022-09-28 MED ORDER — GABAPENTIN 300 MG PO CAPS
ORAL_CAPSULE | ORAL | Status: AC
  Filled 2022-09-28: qty 1

## 2022-09-28 MED ORDER — POVIDONE-IODINE 10 % EX SWAB: 2.0000 | Freq: Once | CUTANEOUS | Status: DC

## 2022-09-28 MED ORDER — ACETAMINOPHEN 500 MG PO TABS
ORAL_TABLET | ORAL | Status: AC
  Filled 2022-09-28: qty 2

## 2022-09-28 MED ORDER — DEXMEDETOMIDINE HCL IN NACL 80 MCG/20ML IV SOLN
INTRAVENOUS | Status: DC | PRN
  Administered 2022-09-28 (×3): 4 ug via BUCCAL

## 2022-09-28 MED ORDER — CEFAZOLIN SODIUM-DEXTROSE 2-4 GM/100ML-% IV SOLN
INTRAVENOUS | Status: AC
  Filled 2022-09-28: qty 100

## 2022-09-28 MED ORDER — GABAPENTIN 300 MG PO CAPS
300.0000 mg | ORAL_CAPSULE | ORAL | Status: AC
  Administered 2022-09-28: 300 mg via ORAL

## 2022-09-28 MED ORDER — PROPOFOL 10 MG/ML IV BOLUS
INTRAVENOUS | Status: DC | PRN
  Administered 2022-09-28: 200 mg via INTRAVENOUS

## 2022-09-28 MED ORDER — PROPOFOL 500 MG/50ML IV EMUL
INTRAVENOUS | Status: DC | PRN
  Administered 2022-09-28: 50 ug/kg/min via INTRAVENOUS

## 2022-09-28 MED ORDER — ACETAMINOPHEN 500 MG PO TABS: 500.0000 mg | ORAL_TABLET | Freq: Four times a day (QID) | ORAL | 0 refills | Status: AC

## 2022-09-28 MED ORDER — DEXAMETHASONE SODIUM PHOSPHATE 10 MG/ML IJ SOLN
INTRAMUSCULAR | Status: DC | PRN
  Administered 2022-09-28: 10 mg via INTRAVENOUS

## 2022-09-28 MED ORDER — PROMETHAZINE HCL 25 MG/ML IJ SOLN: 6.2500 mg | INTRAMUSCULAR | Status: DC | PRN

## 2022-09-28 MED ORDER — POLYETHYLENE GLYCOL 3350 17 G PO PACK: 17.0000 g | PACK | Freq: Every day | ORAL | 0 refills | Status: AC

## 2022-09-28 MED ORDER — OXYCODONE HCL 5 MG PO TABS: 5.0000 mg | ORAL_TABLET | ORAL | 0 refills | Status: DC | PRN

## 2022-09-28 MED ORDER — BUPIVACAINE HCL (PF) 0.5 % IJ SOLN
INTRAMUSCULAR | Status: DC | PRN
  Administered 2022-09-28: 30 mL

## 2022-09-28 MED ORDER — MIDAZOLAM HCL 2 MG/2ML IJ SOLN
INTRAMUSCULAR | Status: DC | PRN
  Administered 2022-09-28: 2 mg via INTRAVENOUS

## 2022-09-28 MED ORDER — ONDANSETRON HCL 4 MG/2ML IJ SOLN
INTRAMUSCULAR | Status: DC | PRN
  Administered 2022-09-28: 4 mg via INTRAVENOUS

## 2022-09-28 MED ORDER — MIDAZOLAM HCL 2 MG/2ML IJ SOLN
INTRAMUSCULAR | Status: AC
  Filled 2022-09-28: qty 2

## 2022-09-28 MED ORDER — ROCURONIUM BROMIDE 10 MG/ML (PF) SYRINGE
PREFILLED_SYRINGE | INTRAVENOUS | Status: DC | PRN
  Administered 2022-09-28: 10 mg via INTRAVENOUS
  Administered 2022-09-28: 70 mg via INTRAVENOUS

## 2022-09-28 MED ORDER — SODIUM CHLORIDE 0.9 % IR SOLN
Status: DC | PRN
  Administered 2022-09-28: 1000 mL

## 2022-09-28 MED ORDER — IBUPROFEN 600 MG PO TABS: 600.0000 mg | ORAL_TABLET | Freq: Four times a day (QID) | ORAL | 2 refills | Status: AC

## 2022-09-28 MED ORDER — SODIUM CHLORIDE (PF) 0.9 % IJ SOLN
INTRAMUSCULAR | Status: AC
  Filled 2022-09-28: qty 10

## 2022-09-28 MED ORDER — CEFAZOLIN SODIUM-DEXTROSE 2-4 GM/100ML-% IV SOLN
2.0000 g | INTRAVENOUS | Status: AC
  Administered 2022-09-28: 2 g via INTRAVENOUS

## 2022-09-28 MED ORDER — DIPHENHYDRAMINE HCL 50 MG/ML IJ SOLN
INTRAMUSCULAR | Status: DC | PRN
  Administered 2022-09-28: 12.5 mg via INTRAVENOUS

## 2022-09-28 MED ORDER — FENTANYL CITRATE (PF) 250 MCG/5ML IJ SOLN
INTRAMUSCULAR | Status: DC | PRN
  Administered 2022-09-28: 100 ug via INTRAVENOUS
  Administered 2022-09-28 (×3): 50 ug via INTRAVENOUS

## 2022-09-28 MED ORDER — FENTANYL CITRATE (PF) 250 MCG/5ML IJ SOLN
INTRAMUSCULAR | Status: AC
  Filled 2022-09-28: qty 5

## 2022-09-28 MED ORDER — CELECOXIB 200 MG PO CAPS
200.0000 mg | ORAL_CAPSULE | Freq: Once | ORAL | Status: AC
  Administered 2022-09-28: 200 mg via ORAL

## 2022-09-28 MED ORDER — HYDROMORPHONE HCL 1 MG/ML IJ SOLN
INTRAMUSCULAR | Status: DC | PRN
  Administered 2022-09-28: .5 mg via INTRAVENOUS

## 2022-09-28 MED ORDER — HYDROMORPHONE HCL 2 MG/ML IJ SOLN
INTRAMUSCULAR | Status: AC
  Filled 2022-09-28: qty 1

## 2022-09-28 MED ORDER — OXYCODONE HCL 5 MG PO TABS: 5.0000 mg | ORAL_TABLET | Freq: Once | ORAL | Status: DC | PRN

## 2022-09-28 MED ORDER — FENTANYL CITRATE (PF) 100 MCG/2ML IJ SOLN: 25.0000 ug | INTRAMUSCULAR | Status: DC | PRN

## 2022-09-28 MED ORDER — CELECOXIB 200 MG PO CAPS
ORAL_CAPSULE | ORAL | Status: AC
  Filled 2022-09-28: qty 1

## 2022-09-28 MED ORDER — LACTATED RINGERS IV SOLN: INTRAVENOUS | Status: DC

## 2022-09-28 MED ORDER — ACETAMINOPHEN 500 MG PO TABS
1000.0000 mg | ORAL_TABLET | Freq: Once | ORAL | Status: AC
  Administered 2022-09-28: 1000 mg via ORAL

## 2022-09-28 MED ORDER — SUGAMMADEX SODIUM 200 MG/2ML IV SOLN
INTRAVENOUS | Status: DC | PRN
  Administered 2022-09-28: 200 mg via INTRAVENOUS

## 2022-09-28 MED ORDER — OXYCODONE HCL 5 MG/5ML PO SOLN: 5.0000 mg | Freq: Once | ORAL | Status: DC | PRN

## 2022-09-28 SURGICAL SUPPLY — 115 items
ADH SKN CLS APL DERMABOND .7 (GAUZE/BANDAGES/DRESSINGS) ×4
APL PRP STRL LF DISP 70% ISPRP (MISCELLANEOUS) ×4
APL SRG 38 LTWT LNG FL B (MISCELLANEOUS)
APPLICATOR ARISTA FLEXITIP XL (MISCELLANEOUS) IMPLANT
APPLIER CLIP 5 13 M/L LIGAMAX5 (MISCELLANEOUS)
APR CLP MED LRG 5 ANG JAW (MISCELLANEOUS)
BARRIER ADHS 3X4 INTERCEED (GAUZE/BANDAGES/DRESSINGS) IMPLANT
BLADE SURG 10 STRL SS (BLADE) IMPLANT
BLADE SURG 15 STRL LF DISP TIS (BLADE) ×1 IMPLANT
BLADE SURG 15 STRL SS (BLADE) ×4
BNDG CMPR 5X2 CHSV 1 LYR STRL (GAUZE/BANDAGES/DRESSINGS) ×4
BNDG COHESIVE 2X5 TAN ST LF (GAUZE/BANDAGES/DRESSINGS) ×1 IMPLANT
BRR ADH 4X3 ABS CNTRL BYND (GAUZE/BANDAGES/DRESSINGS)
CABLE HIGH FREQUENCY MONO STRZ (ELECTRODE) IMPLANT
CATH FOLEY 3WAY  5CC 16FR (CATHETERS) ×4
CATH FOLEY 3WAY 5CC 16FR (CATHETERS) ×4 IMPLANT
CELL SAVER LIPIGURD (MISCELLANEOUS) IMPLANT
CHLORAPREP W/TINT 26 (MISCELLANEOUS) ×4 IMPLANT
CLIP APPLIE 5 13 M/L LIGAMAX5 (MISCELLANEOUS) IMPLANT
COVER BACK TABLE 60X90IN (DRAPES) ×4 IMPLANT
COVER MAYO STAND STRL (DRAPES) ×4 IMPLANT
COVER SURGICAL LIGHT HANDLE (MISCELLANEOUS) IMPLANT
COVER TIP SHEARS 8 DVNC (MISCELLANEOUS) ×4 IMPLANT
COVER TIP SHEARS 8MM DA VINCI (MISCELLANEOUS) ×4
DEFOGGER SCOPE WARMER CLEARIFY (MISCELLANEOUS) ×4 IMPLANT
DERMABOND ADVANCED .7 DNX12 (GAUZE/BANDAGES/DRESSINGS) ×4 IMPLANT
DEVICE RETRIEVAL ALEXIS 14 (MISCELLANEOUS) IMPLANT
DILATOR CANAL MILEX (MISCELLANEOUS) IMPLANT
DRAPE ARM DVNC X/XI (DISPOSABLE) ×9 IMPLANT
DRAPE COLUMN DVNC XI (DISPOSABLE) ×3 IMPLANT
DRAPE DA VINCI XI ARM (DISPOSABLE)
DRAPE DA VINCI XI COLUMN (DISPOSABLE)
DRAPE STERI URO 9X17 APER PCH (DRAPES) ×4 IMPLANT
DRAPE UTILITY XL STRL (DRAPES) ×4 IMPLANT
DRSG COVADERM PLUS 2X2 (GAUZE/BANDAGES/DRESSINGS) IMPLANT
DURAPREP 26ML APPLICATOR (WOUND CARE) ×4 IMPLANT
ELECT REM PT RETURN 9FT ADLT (ELECTROSURGICAL) ×4
ELECTRODE REM PT RTRN 9FT ADLT (ELECTROSURGICAL) ×4 IMPLANT
EXTRT SYSTEM ALEXIS 14CM (MISCELLANEOUS)
EXTRT SYSTEM ALEXIS 17CM (MISCELLANEOUS)
GAUZE 4X4 16PLY ~~LOC~~+RFID DBL (SPONGE) ×7 IMPLANT
GLOVE BIO SURGEON STRL SZ7 (GLOVE) ×12 IMPLANT
GLOVE BIOGEL PI IND STRL 7.0 (GLOVE) ×12 IMPLANT
GLOVE ECLIPSE 6.5 STRL STRAW (GLOVE) ×8 IMPLANT
GOWN STRL REUS W/TWL LRG LVL3 (GOWN DISPOSABLE) ×8 IMPLANT
GYRUS RUMI II 2.5CM BLUE (DISPOSABLE)
GYRUS RUMI II 3.5CM BLUE (DISPOSABLE)
GYRUS RUMI II 4.0CM BLUE (DISPOSABLE)
HEMOSTAT ARISTA ABSORB 3G PWDR (HEMOSTASIS) IMPLANT
HOLDER FOLEY CATH W/STRAP (MISCELLANEOUS) IMPLANT
IRRIG SUCT STRYKERFLOW 2 WTIP (MISCELLANEOUS)
IRRIGATION SUCT STRKRFLW 2 WTP (MISCELLANEOUS) ×3 IMPLANT
KIT PINK PAD W/HEAD ARE REST (MISCELLANEOUS) ×4
KIT PINK PAD W/HEAD ARM REST (MISCELLANEOUS) ×4 IMPLANT
KIT TURNOVER CYSTO (KITS) ×4 IMPLANT
LEGGING LITHOTOMY PAIR STRL (DRAPES) ×4 IMPLANT
LIGASURE VESSEL 5MM BLUNT TIP (ELECTROSURGICAL) ×3 IMPLANT
MANIPULATOR ADVINCU DEL 2.5 PL (MISCELLANEOUS) IMPLANT
MANIPULATOR ADVINCU DEL 3.0 PL (MISCELLANEOUS) IMPLANT
MANIPULATOR ADVINCU DEL 3.5 PL (MISCELLANEOUS) IMPLANT
MANIPULATOR ADVINCU DEL 4.0 PL (MISCELLANEOUS) IMPLANT
NDL INSUFFLATION 14GA 120MM (NEEDLE) ×3 IMPLANT
NEEDLE INSUFFLATION 14GA 120MM (NEEDLE) IMPLANT
NS IRRIG 1000ML POUR BTL (IV SOLUTION) ×4 IMPLANT
OBTURATOR OPTICAL STANDARD 8MM (TROCAR) ×4
OBTURATOR OPTICAL STND 8 DVNC (TROCAR) ×4
OBTURATOR OPTICALSTD 8 DVNC (TROCAR) ×4 IMPLANT
OCCLUDER COLPOPNEUMO (BALLOONS) ×4 IMPLANT
PACK LAPAROSCOPY BASIN (CUSTOM PROCEDURE TRAY) ×4 IMPLANT
PACK ROBOT WH (CUSTOM PROCEDURE TRAY) ×3 IMPLANT
PACK ROBOTIC GOWN (GOWN DISPOSABLE) ×4 IMPLANT
PAD OB MATERNITY 4.3X12.25 (PERSONAL CARE ITEMS) ×4 IMPLANT
PAD PREP 24X48 CUFFED NSTRL (MISCELLANEOUS) ×4 IMPLANT
PENCIL SMOKE EVACUATOR (MISCELLANEOUS) IMPLANT
POUCH LAPAROSCOPIC INSTRUMENT (MISCELLANEOUS) ×3 IMPLANT
PROTECTOR NERVE ULNAR (MISCELLANEOUS) ×6 IMPLANT
RUMI II 3.0CM BLUE KOH-EFFICIE (DISPOSABLE) ×1 IMPLANT
RUMI II GYRUS 2.5CM BLUE (DISPOSABLE) IMPLANT
RUMI II GYRUS 3.5CM BLUE (DISPOSABLE) IMPLANT
RUMI II GYRUS 4.0CM BLUE (DISPOSABLE) IMPLANT
SCISSORS LAP 5X35 DISP (ENDOMECHANICALS) IMPLANT
SEAL CANN UNIV 5-8 DVNC XI (MISCELLANEOUS) ×9 IMPLANT
SEAL XI 5MM-8MM UNIVERSAL (MISCELLANEOUS)
SEALER VESSEL DA VINCI XI (MISCELLANEOUS)
SEALER VESSEL EXT DVNC XI (MISCELLANEOUS) ×3 IMPLANT
SET IRRIG Y TYPE TUR BLADDER L (SET/KITS/TRAYS/PACK) ×4 IMPLANT
SET SUCTION IRRIG HYDROSURG (IRRIGATION / IRRIGATOR) ×4 IMPLANT
SET TRI-LUMEN FLTR TB AIRSEAL (TUBING) ×4 IMPLANT
SHEARS HARMONIC ACE PLUS 36CM (ENDOMECHANICALS) ×4 IMPLANT
SPIKE FLUID TRANSFER (MISCELLANEOUS) ×4 IMPLANT
SPONGE T-LAP 4X18 ~~LOC~~+RFID (SPONGE) ×4 IMPLANT
STRIP CLOSURE SKIN 1/2X4 (GAUZE/BANDAGES/DRESSINGS) ×1 IMPLANT
SUT VIC AB 0 CT1 27 (SUTURE) ×8
SUT VIC AB 0 CT1 27XBRD ANBCTR (SUTURE) ×8 IMPLANT
SUT VIC AB 4-0 PS2 18 (SUTURE) ×8 IMPLANT
SUT VICRYL 0 UR6 27IN ABS (SUTURE) IMPLANT
SUT VLOC 180 0 9IN  GS21 (SUTURE) ×4
SUT VLOC 180 0 9IN GS21 (SUTURE) ×4 IMPLANT
SYR 10ML LL (SYRINGE) ×4 IMPLANT
SYR 50ML LL SCALE MARK (SYRINGE) ×7 IMPLANT
SYSTEM CARTER THOMASON II (TROCAR) IMPLANT
SYSTEM CONTND EXTRCTN KII BLLN (MISCELLANEOUS) IMPLANT
TIP RUMI ORANGE 6.7MMX12CM (TIP) ×3 IMPLANT
TIP UTERINE 5.1X6CM LAV DISP (MISCELLANEOUS) ×3 IMPLANT
TIP UTERINE 6.7X10CM GRN DISP (MISCELLANEOUS) ×3 IMPLANT
TIP UTERINE 6.7X6CM WHT DISP (MISCELLANEOUS) ×3 IMPLANT
TIP UTERINE 6.7X8CM BLUE DISP (MISCELLANEOUS) ×4 IMPLANT
TOWEL OR 17X26 10 PK STRL BLUE (TOWEL DISPOSABLE) ×8 IMPLANT
TRAY FOLEY W/BAG SLVR 14FR LF (SET/KITS/TRAYS/PACK) ×4 IMPLANT
TROCAR ADV FIXATION 5X100MM (TROCAR) ×4 IMPLANT
TROCAR KII 8X100ML NONTHREADED (TROCAR) ×3 IMPLANT
TROCAR PORT AIRSEAL 5X120 (TROCAR) ×4 IMPLANT
TROCAR Z-THREAD FIOS 5X100MM (TROCAR) ×4 IMPLANT
WARMER LAPAROSCOPE (MISCELLANEOUS) ×4 IMPLANT
WATER STERILE IRR 1000ML POUR (IV SOLUTION) ×4 IMPLANT

## 2022-09-28 NOTE — Transfer of Care (Signed)
Immediate Anesthesia Transfer of Care Note  Patient: Jillian [name]  Procedure(s) Performed: TOTAL LAPAROSCOPIC HYSTERECTOMY WITH SALPINGECTOMY (Bilateral: Pelvis) CYSTOSCOPY (Bladder) REMOVAL OF DRUG DELIVERY IMPLANT (Left: Arm Upper)  Patient Location: PACU  Anesthesia Type:General  Level of Consciousness: awake, alert , and oriented  Airway & Oxygen Therapy: Patient Spontanous Breathing and Patient connected to face mask oxygen  Post-op Assessment: Report given to RN and Post -op Vital signs reviewed and stable  Post vital signs: Reviewed and stable  Last Vitals:  Vitals Value Taken Time  BP 137/93 09/28/22 1215  Temp    Pulse 132 09/28/22 1216  Resp 23 09/28/22 1216  SpO2 99 % 09/28/22 1216  Vitals shown include unvalidated device data.  Last Pain:  Vitals:   09/28/22 0731  TempSrc: Oral  PainSc: 0-No pain      Patients Stated Pain Goal: 3 ([id number])  Complications: No notable events documented.

## 2022-09-28 NOTE — Interval H&P Note (Signed)
History and Physical Interval Note:  09/28/2022 7:24 AM  Jillian [name]  has presented today for surgery, with the diagnosis of AUB.  The various methods of treatment have been discussed with the patient and family. After consideration of risks, benefits and other options for treatment, the patient has consented to  Procedure(s): TOTAL LAPAROSCOPIC HYSTERECTOMY WITH SALPINGECTOMY (Bilateral) CYSTOSCOPY (N/A) REMOVAL OF DRUG DELIVERY IMPLANT (N/A) POSSIBLE XI ROBOTIC ASSISTED TOTAL HYSTERECTOMY WITH SALPINGECTOMY (Bilateral) as a surgical intervention.  The patient's history has been reviewed, patient examined, no change in status, stable for surgery.  I have reviewed the patient's chart and labs.  Questions were answered to the patient's satisfaction.     Colbi Staubs

## 2022-09-28 NOTE — Anesthesia Postprocedure Evaluation (Signed)
Anesthesia Post Note  Patient: Jillian [name]  Procedure(s) Performed: TOTAL LAPAROSCOPIC HYSTERECTOMY WITH SALPINGECTOMY (Bilateral: Pelvis) CYSTOSCOPY (Bladder) REMOVAL OF DRUG DELIVERY IMPLANT (Left: Arm Upper)     Patient location during evaluation: PACU Anesthesia Type: General Level of consciousness: awake and alert Pain management: pain level controlled Vital Signs Assessment: post-procedure vital signs reviewed and stable Respiratory status: spontaneous breathing, nonlabored ventilation and respiratory function stable Cardiovascular status: stable, blood pressure returned to baseline and tachycardic Anesthetic complications: no   No notable events documented.  Last Vitals:  Vitals:   09/28/22 1300 09/28/22 1315  BP: (!) 140/88 (!) 142/92  Pulse: (!) 125 (!) 120  Resp: 18 16  Temp:    SpO2: 96% 97%    Last Pain:  Vitals:   09/28/22 1245  TempSrc:   PainSc: [place] Ruthetta Koopmann

## 2022-09-28 NOTE — Brief Op Note (Signed)
09/28/2022  11:51 AM  PATIENT:  Jillian [name]  27 y.o. female  PRE-OPERATIVE DIAGNOSIS:  AUB  POST-OPERATIVE DIAGNOSIS:  AUB  PROCEDURE:  Procedure(s): TOTAL LAPAROSCOPIC HYSTERECTOMY WITH SALPINGECTOMY (Bilateral) CYSTOSCOPY (N/A) REMOVAL OF DRUG DELIVERY IMPLANT (Left)  SURGEON:  Surgeon(s) and Role:    Darliss Cheney, MD - Primary    * Lavonia Drafts, MD - Assisting  PHYSICIAN ASSISTANT: n/a  ASSISTANTS: harraway-smith   ANESTHESIA:   local and general  EBL:  25 mL   BLOOD ADMINISTERED:none  DRAINS: none   LOCAL MEDICATIONS USED:  BUPIVICAINE   SPECIMEN:  Source of Specimen:  uterus, cervix, bilateral fallopian tubes  DISPOSITION OF SPECIMEN:  PATHOLOGY  COUNTS:  YES  TOURNIQUET:  * No tourniquets in log *  DICTATION: .Note written in EPIC  PLAN OF CARE: Discharge to home after PACU  PATIENT DISPOSITION:  PACU - hemodynamically stable.   Delay start of Pharmacological VTE agent (>24hrs) due to surgical blood loss or risk of bleeding: not applicable

## 2022-09-28 NOTE — Op Note (Addendum)
Jillian [name] PROCEDURE DATE: 09/28/2022  PREOPERATIVE DIAGNOSIS: abnormal uterine bleeding, dysmenorrhea  POSTOPERATIVE DIAGNOSIS: same PROCEDURE:    total laparoscopic hysterectomy, bilateral salpingectomy, cystoscopy and removal of nexplanon  SURGEON: Darliss Cheney, MD ASSIST: Lavonia Drafts, MD  An experienced assistant was required given the standard of surgical care given the complexity of the case.  This assistant was needed for exposure, dissection, suctioning, retraction, instrument exchange, and for overall help during the procedure.   INDICATIONS: 27 y.o. G0P0000 with fibroids.  Risks of surgery were discussed with the patient including but not limited to: bleeding which may require transfusion; infection which may require antibiotics; injury to uterus or surrounding organs; need for additional procedures including laparotomy or laparoscopy; possibility of intrauterine scarring which may impair future fertility; and other postoperative/anesthesia complications. Written informed consent was obtained.    FINDINGS:  nexplanon device palpated in left upper arm. A 8 week size uterus,  specimen sent to pathology. Normal uppera bdoinal survey. Normal bilateral ovaries. Normal bilateral fallopian tubes. Normal appearing uterus with normal contours. Bilateral ureters visualized through peritoneum. On cystoscopy, normal intact bladder wall, bilateral ureteral orifices with flow of urine visualized through both  ANESTHESIA: General INTRAVENOUS FLUIDS:  1000 ml of LR ESTIMATED BLOOD LOSS:  25 cc SPECIMENS:  uterus, cervix, bilateral fallopian tubes, nexplanon COMPLICATIONS:  None immediate.   PROCEDURE IN DETAIL:  The patient received intravenous antibiotics and had sequential compression devices applied to her lower extremities while in the preoperative area.  She was then taken to the operating room where general anesthesia was administered and was found to be adequate. A time out was  performed. The left arm was then extended and the device palpated in the upper arm. The area was prepped with betadine. An 11 blade was then used to incise the skin and the device reached and removed intact. The skin incision was covered with steri strips and the arm wrapped.  She was then placed in the dorsal lithotomy position, and was prepped and draped in a sterile manner.   A Foley catheter was inserted into her bladder and attached to constant drainage. A speculum was placed in the vagina and the cervix visualized and the anterior lip of the cervix grasped with a single tooth tenaculum. The uterus was sounded to 8cm and the cervix measured. A RUMI uterine manipulator was placed at this time.  After an adequate timeout was performed, attention was turned to the abdomen where an umbilical incision was made with the scalpel.  The Optiview 5-mm trocar and sleeve were then advanced without difficulty with the laparoscope under direct visualization into the abdomen.  The abdomen was then insufflated with carbon dioxide gas and adequate pneumoperitoneum was obtained.  A survey of the patient's pelvis and abdomen revealed the findings above.  Bilateral lower quadrant ports (60m on the left and 535mair seal on the left) were then placed under direct visualization.  The pelvis was then carefully examined.  Attention was turned to the right fallopian tube and the mesosalpinx was serially divided using the harmonic scalpel device until the cornua reached. The utero-ovarian ligament was coagulated and divided followed by the round. The leaves of the broad ligament were divided and the anterior leaf divided through the lower uterine segment and the bladder was then bluntly dissected off the lower uterine segment.  At this point, attention was turned to the uterine vessels, which were clamped and ligated using the Harmonic scalpel. Attention was turned to the left side where  the same procedure was carried out.  Good  hemostasis was noted overall.  Attention was then turned to the cervicovaginal junction, and the Harmonic device was used to transect the cervix from the surrounding vagina using the ring of the RUMI as a guide. This was done circumferentially allowing total hysterectomy.  The uterus with fallopian tubes was then removed from the vagina and the vaginal cuff incision. The vaginal cuff was closed with a 0 vloc suture in a running fashion.  Overall excellent hemostasis was noted.  The ureters were reexamined bilaterally and were pulsating normally. The abdominal pressure was reduced and hemostasis was confirmed.    Cystoscopy was performed using 70 degree scope after foley removed.  No stitches were visualized in the bladder during cystoscopy and efflux of urine visualized from bilateral ureteral orifices.  All trocars were removed and the abdomen was desufflated.  All skin incisions were closed with 4-0 Vicryl subcuticular stitches and Dermabond. The patient tolerated the procedures well.  All instruments, needles, and sponge counts were correct x 2. The patient was taken to the recovery room awake, extubated and in stable condition.

## 2022-09-28 NOTE — Anesthesia Preprocedure Evaluation (Addendum)
Anesthesia Evaluation  Patient identified by MRN, date of birth, ID band Patient awake    Reviewed: Allergy & Precautions, NPO status , Patient's Chart, lab work & pertinent test results  History of Anesthesia Complications Negative for: history of anesthetic complications  Airway Mallampati: III  TM Distance: >3 FB Neck ROM: Full    Dental  (+) Dental Advisory Given, Teeth Intact   Pulmonary neg pulmonary ROS   Pulmonary exam normal        Cardiovascular negative cardio ROS Normal cardiovascular exam     Neuro/Psych  Headaches PSYCHIATRIC DISORDERS Anxiety Depression     OCD    GI/Hepatic negative GI ROS, Neg liver ROS,,,  Endo/Other  diabetes, Type 2, Oral Hypoglycemic Agents    Renal/GU negative Renal ROS     Musculoskeletal negative musculoskeletal ROS (+)    Abdominal   Peds  Hematology negative hematology ROS (+)   Anesthesia Other Findings On GLP1-a   Reproductive/Obstetrics  AUB                              Anesthesia Physical Anesthesia Plan  ASA: 2  Anesthesia Plan: General   Post-op Pain Management: Tylenol PO (pre-op)* and Celebrex PO (pre-op)*   Induction: Intravenous  PONV Risk Score and Plan: 3 and Treatment may vary due to age or medical condition, Ondansetron, Dexamethasone, Midazolam and Scopolamine patch - Pre-op  Airway Management Planned: Oral ETT  Additional Equipment: None  Intra-op Plan:   Post-operative Plan: Extubation in OR  Informed Consent: I have reviewed the patients History and Physical, chart, labs and discussed the procedure including the risks, benefits and alternatives for the proposed anesthesia with the patient or authorized representative who has indicated his/her understanding and acceptance.     Dental advisory given  Plan Discussed with: CRNA and Anesthesiologist  Anesthesia Plan Comments:        Anesthesia Quick  Evaluation

## 2022-09-28 NOTE — Anesthesia Procedure Notes (Addendum)
Procedure Name: Intubation Date/Time: 09/28/2022 9:53 AM  Performed by: Clearnce Sorrel, CRNAPre-anesthesia Checklist: Patient identified, Emergency Drugs available, Suction available, Patient being monitored and Timeout performed Patient Re-evaluated:Patient Re-evaluated prior to induction Oxygen Delivery Method: Circle System Utilized Preoxygenation: Pre-oxygenation with 100% oxygen Induction Type: IV induction Ventilation: Mask ventilation without difficulty and Oral airway inserted - appropriate to patient size Laryngoscope Size: Mac and 3 Grade View: Grade II Tube type: Oral Tube size: 7.0 mm Number of attempts: 1 Airway Equipment and Method: Stylet and Oral airway Placement Confirmation: ETT inserted through vocal cords under direct vision, positive ETCO2 and breath sounds checked- equal and bilateral Secured at: 23 cm Tube secured with: Tape Dental Injury: Teeth and Oropharynx as per pre-operative assessment

## 2022-09-28 NOTE — Discharge Instructions (Addendum)
Take your ibuprofen and tylenol every 6 hours for the first 3 days, regardless of how you feel. You may take the stairs. No driving for 2 weeks. No heavy lifting for 6 weeks.       No acetaminophen/Tylenol until after 2:00 pm today if needed.  No ibuprofen, Advil, Aleve, Motrin, ketorolac, meloxicam, naproxen, or other NSAIDS until after 2:00 pm today if needed.   Post Anesthesia Home Care Instructions  Activity: Get plenty of rest for the remainder of the day. A responsible individual must stay with you for 24 hours following the procedure.  For the next 24 hours, DO NOT: -Drive a car -Paediatric nurse -Drink alcoholic beverages -Take any medication unless instructed by your physician -Make any legal decisions or sign important papers.  Meals: Start with liquid foods such as gelatin or soup. Progress to regular foods as tolerated. Avoid greasy, spicy, heavy foods. If nausea and/or vomiting occur, drink only clear liquids until the nausea and/or vomiting subsides. Call your physician if vomiting continues.  Special Instructions/Symptoms: Your throat may feel dry or sore from the anesthesia or the breathing tube placed in your throat during surgery. If this causes discomfort, gargle with warm salt water. The discomfort should disappear within 24 hours.

## 2022-09-29 ENCOUNTER — Other Ambulatory Visit: Payer: Self-pay | Admitting: Obstetrics and Gynecology

## 2022-09-29 ENCOUNTER — Telehealth: Payer: Self-pay | Admitting: Family Medicine

## 2022-09-29 ENCOUNTER — Encounter (HOSPITAL_BASED_OUTPATIENT_CLINIC_OR_DEPARTMENT_OTHER): Payer: Self-pay | Admitting: Obstetrics and Gynecology

## 2022-09-29 DIAGNOSIS — N939 Abnormal uterine and vaginal bleeding, unspecified: Secondary | ICD-10-CM

## 2022-09-29 LAB — SURGICAL PATHOLOGY

## 2022-09-29 MED ORDER — OXYCODONE HCL 5 MG PO TABS: 5.0000 mg | ORAL_TABLET | ORAL | 0 refills | Status: AC | PRN

## 2022-09-29 NOTE — Telephone Encounter (Signed)
Patient called in wanting to get her oxycodine prescription switched to [facility] on [address].

## 2022-09-29 NOTE — Telephone Encounter (Signed)
Patient would like a call back about her medication after surgery. [facility] not [facility] and [facility] needs [facility] on [address].

## 2022-09-30 ENCOUNTER — Other Ambulatory Visit: Payer: BC Managed Care – PPO | Admitting: Obstetrics and Gynecology

## 2022-09-30 DIAGNOSIS — F3132 Bipolar disorder, current episode depressed, moderate: Secondary | ICD-10-CM | POA: Diagnosis not present

## 2022-09-30 NOTE — Telephone Encounter (Signed)
Called pt; pt states oxycodone was out of stock at her preferred pharmacy. Pt was able to transfer prescription and has no other questions.

## 2022-10-07 DIAGNOSIS — F3132 Bipolar disorder, current episode depressed, moderate: Secondary | ICD-10-CM | POA: Diagnosis not present

## 2022-10-14 DIAGNOSIS — F3132 Bipolar disorder, current episode depressed, moderate: Secondary | ICD-10-CM | POA: Diagnosis not present

## 2022-10-21 DIAGNOSIS — F3132 Bipolar disorder, current episode depressed, moderate: Secondary | ICD-10-CM | POA: Diagnosis not present

## 2022-10-28 DIAGNOSIS — F3132 Bipolar disorder, current episode depressed, moderate: Secondary | ICD-10-CM | POA: Diagnosis not present

## 2022-11-03 ENCOUNTER — Encounter: Payer: Self-pay | Admitting: Obstetrics and Gynecology

## 2022-11-04 DIAGNOSIS — F3132 Bipolar disorder, current episode depressed, moderate: Secondary | ICD-10-CM | POA: Diagnosis not present

## 2022-11-09 ENCOUNTER — Ambulatory Visit (INDEPENDENT_AMBULATORY_CARE_PROVIDER_SITE_OTHER): Payer: BC Managed Care – PPO | Admitting: Obstetrics and Gynecology

## 2022-11-09 ENCOUNTER — Encounter: Payer: Self-pay | Admitting: Obstetrics and Gynecology

## 2022-11-09 VITALS — BP 136/87 | HR 118

## 2022-11-09 DIAGNOSIS — Z09 Encounter for follow-up examination after completed treatment for conditions other than malignant neoplasm: Secondary | ICD-10-CM

## 2022-11-09 NOTE — Progress Notes (Signed)
   POSTOPERATIVE VISIT NOTE   Subjective:     Jillian [name] is a 27 y.o. G0P0000 who presents to the clinic weeks status post  laparoscopic total hysterectomy, bilateral salpingectomy, cystoscopy, and nexplanon removal  for abnormal uterine bleeding on 1/2. Eating a regular diet without difficulty. Bowel movements are normal. The patient is not having any pain.  The following portions of the patient's history were reviewed and updated as appropriate: allergies, current medications, past family history, past medical history, past social history, past surgical history, and problem list.. Last pap on 04/2020 was negative, **negative HPV.  Review of Systems Pertinent items are noted in HPI.    Objective:    BP 136/87   Pulse (!) 118   LMP 09/08/2022 (Approximate)  General:  alert, cooperative, and no distress  Abdomen: soft, bowel sounds active, non-tender  Incision:   healing well, no drainage, no erythema, no hernia, no seroma, no swelling, no dehiscence, incision well approximated  Pelvic:  Normal external genitalia. Normal appearing cuff, visually intact, nontender on palaption with small suture palpated on left of cuff    Pathology Results: FINAL MICROSCOPIC DIAGNOSIS:   A. UTERUS, CERVIX, BILATERAL FALLOPIAN TUBES, HYSTERECTOMY AND  SALPINGECTOMY:  - Uterus with benign inactive endometrium  - Benign unremarkable cervix  - Benign unremarkable bilateral fallopian tubes  - No evidence of malignancy    Assessment:   Doing well postoperatively. Operative findings again reviewed. Pathology report discussed.    Plan:   1. Continue any current medications. 2. No longer needs pap smears 3. Activity restrictions: none 4. Anticipated return to work: now. 5. Follow up as needed   Darliss Cheney, MD Minimally Invasive Gynecologic Surgeon and Pelvic Pain Specialist [place], [place] for [employer], [place]

## 2022-11-11 DIAGNOSIS — F3132 Bipolar disorder, current episode depressed, moderate: Secondary | ICD-10-CM | POA: Diagnosis not present

## 2022-11-18 DIAGNOSIS — F3132 Bipolar disorder, current episode depressed, moderate: Secondary | ICD-10-CM | POA: Diagnosis not present

## 2022-11-25 DIAGNOSIS — F3132 Bipolar disorder, current episode depressed, moderate: Secondary | ICD-10-CM | POA: Diagnosis not present

## 2022-12-02 DIAGNOSIS — F3132 Bipolar disorder, current episode depressed, moderate: Secondary | ICD-10-CM | POA: Diagnosis not present

## 2022-12-09 DIAGNOSIS — F3132 Bipolar disorder, current episode depressed, moderate: Secondary | ICD-10-CM | POA: Diagnosis not present

## 2022-12-16 DIAGNOSIS — F3132 Bipolar disorder, current episode depressed, moderate: Secondary | ICD-10-CM | POA: Diagnosis not present

## 2022-12-23 DIAGNOSIS — F3132 Bipolar disorder, current episode depressed, moderate: Secondary | ICD-10-CM | POA: Diagnosis not present

## 2022-12-30 DIAGNOSIS — F3132 Bipolar disorder, current episode depressed, moderate: Secondary | ICD-10-CM | POA: Diagnosis not present

## 2023-01-06 DIAGNOSIS — F3132 Bipolar disorder, current episode depressed, moderate: Secondary | ICD-10-CM | POA: Diagnosis not present

## 2023-01-13 DIAGNOSIS — F3132 Bipolar disorder, current episode depressed, moderate: Secondary | ICD-10-CM | POA: Diagnosis not present

## 2023-01-20 DIAGNOSIS — F3132 Bipolar disorder, current episode depressed, moderate: Secondary | ICD-10-CM | POA: Diagnosis not present

## 2023-01-27 DIAGNOSIS — F3132 Bipolar disorder, current episode depressed, moderate: Secondary | ICD-10-CM | POA: Diagnosis not present

## 2023-02-03 DIAGNOSIS — F3132 Bipolar disorder, current episode depressed, moderate: Secondary | ICD-10-CM | POA: Diagnosis not present

## 2023-02-10 DIAGNOSIS — F3132 Bipolar disorder, current episode depressed, moderate: Secondary | ICD-10-CM | POA: Diagnosis not present

## 2023-02-17 DIAGNOSIS — F3132 Bipolar disorder, current episode depressed, moderate: Secondary | ICD-10-CM | POA: Diagnosis not present

## 2023-02-24 DIAGNOSIS — F3132 Bipolar disorder, current episode depressed, moderate: Secondary | ICD-10-CM | POA: Diagnosis not present

## 2023-03-03 DIAGNOSIS — F3132 Bipolar disorder, current episode depressed, moderate: Secondary | ICD-10-CM | POA: Diagnosis not present

## 2023-03-10 DIAGNOSIS — F3132 Bipolar disorder, current episode depressed, moderate: Secondary | ICD-10-CM | POA: Diagnosis not present

## 2023-03-17 DIAGNOSIS — F3132 Bipolar disorder, current episode depressed, moderate: Secondary | ICD-10-CM | POA: Diagnosis not present

## 2023-03-24 DIAGNOSIS — F251 Schizoaffective disorder, depressive type: Secondary | ICD-10-CM | POA: Diagnosis not present

## 2023-03-24 DIAGNOSIS — F429 Obsessive-compulsive disorder, unspecified: Secondary | ICD-10-CM | POA: Diagnosis not present

## 2023-03-24 DIAGNOSIS — F3132 Bipolar disorder, current episode depressed, moderate: Secondary | ICD-10-CM | POA: Diagnosis not present

## 2023-03-31 DIAGNOSIS — F3132 Bipolar disorder, current episode depressed, moderate: Secondary | ICD-10-CM | POA: Diagnosis not present

## 2023-04-07 DIAGNOSIS — F3132 Bipolar disorder, current episode depressed, moderate: Secondary | ICD-10-CM | POA: Diagnosis not present

## 2023-04-08 DIAGNOSIS — Z6841 Body Mass Index (BMI) 40.0 and over, adult: Secondary | ICD-10-CM | POA: Diagnosis not present

## 2023-04-08 DIAGNOSIS — Z1339 Encounter for screening examination for other mental health and behavioral disorders: Secondary | ICD-10-CM | POA: Diagnosis not present

## 2023-04-08 DIAGNOSIS — E559 Vitamin D deficiency, unspecified: Secondary | ICD-10-CM | POA: Diagnosis not present

## 2023-04-08 DIAGNOSIS — R5383 Other fatigue: Secondary | ICD-10-CM | POA: Diagnosis not present

## 2023-04-08 DIAGNOSIS — Z1331 Encounter for screening for depression: Secondary | ICD-10-CM | POA: Diagnosis not present

## 2023-04-08 DIAGNOSIS — Z3200 Encounter for pregnancy test, result unknown: Secondary | ICD-10-CM | POA: Diagnosis not present

## 2023-04-08 DIAGNOSIS — Z1159 Encounter for screening for other viral diseases: Secondary | ICD-10-CM | POA: Diagnosis not present

## 2023-04-08 DIAGNOSIS — Z0000 Encounter for general adult medical examination without abnormal findings: Secondary | ICD-10-CM | POA: Diagnosis not present

## 2023-04-14 DIAGNOSIS — F3132 Bipolar disorder, current episode depressed, moderate: Secondary | ICD-10-CM | POA: Diagnosis not present

## 2023-04-21 DIAGNOSIS — F3132 Bipolar disorder, current episode depressed, moderate: Secondary | ICD-10-CM | POA: Diagnosis not present

## 2023-04-23 ENCOUNTER — Other Ambulatory Visit: Payer: Self-pay

## 2023-04-23 ENCOUNTER — Emergency Department (HOSPITAL_BASED_OUTPATIENT_CLINIC_OR_DEPARTMENT_OTHER)
Admission: EM | Admit: 2023-04-23 | Discharge: 2023-04-23 | Disposition: A | Discharge status: Home / Self Care | Payer: BC Managed Care – PPO | Attending: Emergency Medicine | Admitting: Emergency Medicine

## 2023-04-23 DIAGNOSIS — Z91040 Latex allergy status: Secondary | ICD-10-CM | POA: Insufficient documentation

## 2023-04-23 DIAGNOSIS — J069 Acute upper respiratory infection, unspecified: Secondary | ICD-10-CM | POA: Diagnosis not present

## 2023-04-23 DIAGNOSIS — J029 Acute pharyngitis, unspecified: Secondary | ICD-10-CM | POA: Diagnosis not present

## 2023-04-23 DIAGNOSIS — B9789 Other viral agents as the cause of diseases classified elsewhere: Secondary | ICD-10-CM | POA: Diagnosis not present

## 2023-04-23 DIAGNOSIS — Z1152 Encounter for screening for COVID-19: Secondary | ICD-10-CM | POA: Insufficient documentation

## 2023-04-23 LAB — GROUP A STREP BY PCR: Group A Strep by PCR: NOT DETECTED

## 2023-04-23 LAB — RESP PANEL BY RT-PCR (RSV, FLU A&B, COVID)  RVPGX2
Influenza A by PCR: NEGATIVE
Influenza B by PCR: NEGATIVE
Resp Syncytial Virus by PCR: NEGATIVE
SARS Coronavirus 2 by RT PCR: NEGATIVE

## 2023-04-23 NOTE — ED Triage Notes (Signed)
Sore throat since this AM. Concerned because she works around many other co-workers. Wants to be tested to avoid spreading illness to co-workers. Afebrile. Slight cough. Eating and drinking well.

## 2023-04-23 NOTE — ED Provider Notes (Signed)
[place] EMERGENCY DEPARTMENT AT [facility] Provider Note   CSN: [id number] Arrival date & time: 04/23/23  2008     History  Chief Complaint  Patient presents with   Sore Throat    TRINESE WOODLE is a 27 y.o. female.   Sore Throat     27 year old female presenting to the emergency department with 1 day of URI symptoms.  The patient endorses a sore throat, nonproductive cough.  She denies any fevers, endorses subjective warmth and occasional chills.  She is eating and drinking normally.  She is around other coworkers and did not want to spread COVID and came in for testing.  Has any abdominal pain, nausea or vomiting or diarrhea.  No chest pain or shortness of breath. No unilateral nature to her sore throat.  Home Medications Prior to Admission medications   Medication Sig Start Date End Date Taking? Authorizing Provider  acetaminophen (TYLENOL) 500 MG tablet Take 1 tablet (500 mg total) by mouth every 6 (six) hours. 09/28/22   Lorriane Shire, MD  ARIPiprazole (ABILIFY) 2 MG tablet Take 1 tablet (2 mg total) by mouth daily. 08/05/14   Adonis Brook, NP  atorvastatin (LIPITOR) 10 MG tablet Take 10 mg by mouth daily.    [provider]  Dulaglutide (TRULICITY [place]) Inject into the skin.    [provider]  ibuprofen (ADVIL) 600 MG tablet Take 1 tablet (600 mg total) by mouth every 6 (six) hours. 09/28/22   Lorriane Shire, MD  lisinopril (ZESTRIL) 5 MG tablet Take 5 mg by mouth daily.    [provider]  metFORMIN (GLUCOPHAGE) 1000 MG tablet Take 1,000 mg by mouth 2 (two) times daily with a meal.    [provider]  oxyCODONE (OXY IR/ROXICODONE) 5 MG immediate release tablet Take 1 tablet (5 mg total) by mouth every 4 (four) hours as needed for severe pain or breakthrough pain. Patient not taking: Reported on 11/09/2022 09/29/22   Lorriane Shire, MD  polyethylene glycol (MIRALAX) 17 g packet Take 17 g by mouth daily. 09/28/22   Lorriane Shire, MD  traZODone (DESYREL) 100 MG tablet Take 1 tablet (100 mg total) by mouth at bedtime as needed for sleep. Patient not taking: Reported on 09/14/2022 08/05/14   Adonis Brook, NP  venlafaxine [facility]) 100 MG tablet Take 300 mg by mouth 2 (two) times daily.    [provider]      Allergies    Chicken allergy and Latex    Review of Systems   Review of Systems  HENT:  Positive for sore throat.   Respiratory:  Positive for cough.   All other systems reviewed and are negative.   Physical Exam Updated Vital Signs BP 137/88 (BP Location: Right Arm)   Pulse (!) 103   Temp 98 F (36.7 C) (Oral)   Resp 16   LMP 09/08/2022 (Approximate)   SpO2 99%  Physical Exam Vitals and nursing note reviewed.  Constitutional:      General: She is not in acute distress.    Appearance: She is well-developed.  HENT:     Head: Normocephalic and atraumatic.     Right Ear: Tympanic membrane normal.     Left Ear: Tympanic membrane normal.     Mouth/Throat:     Pharynx: Posterior oropharyngeal erythema present.     Tonsils: No tonsillar exudate or tonsillar abscesses. 0 on the right. 0 on the left.  Eyes:     Conjunctiva/sclera: Conjunctivae normal.  Cardiovascular:  Rate and Rhythm: Normal rate and regular rhythm.     Heart sounds: No murmur heard. Pulmonary:     Effort: Pulmonary effort is normal. No respiratory distress.     Breath sounds: Normal breath sounds.  Abdominal:     Palpations: Abdomen is soft.     Tenderness: There is no abdominal tenderness.  Musculoskeletal:        General: No swelling.     Cervical back: Neck supple.  Skin:    General: Skin is warm and dry.     Capillary Refill: Capillary refill takes less than 2 seconds.  Neurological:     Mental Status: She is alert.  Psychiatric:        Mood and Affect: Mood normal.     ED Results / Procedures / Treatments   Labs (all labs ordered are listed, but only abnormal results are displayed) Labs  Reviewed  RESP PANEL BY RT-PCR (RSV, FLU A&B, COVID)  [id number]  GROUP A STREP BY PCR    EKG None  Radiology No results found.  Procedures Procedures    Medications Ordered in ED Medications - No data to display  ED Course/ Medical Decision Making/ A&P                             Medical Decision Making   27 year old female presenting to the emergency department with 1 day of URI symptoms.  The patient endorses a sore throat, nonproductive cough.  She denies any fevers, endorses subjective warmth and occasional chills.  She is eating and drinking normally.  She is around other coworkers and did not want to spread COVID and came in for testing.  Has any abdominal pain, nausea or vomiting or diarrhea.  No chest pain or shortness of breath. No unilateral nature to her sore throat.   On arrival, the patient was vitally stable.  Afebrile, overall well-appearing.  On my exam, the patient is well-appearing and well-hydrated.  The patient's lungs are clear to auscultation bilaterally. Additionally, the patient has a soft/non-tender abdomen, clear tympanic membranes, and no oropharyngeal exudates.  There are no signs of meningismus.  Mild oropharyngeal erythema present.  I see no signs of an acute bacterial infection.  The patient's presentation is most consistent with a viral upper respiratory infection.  I have a low suspicion for pneumonia as the patient's cough has been non-productive and the patient is neither tachypneic nor hypoxic on room air.  Additionally, the patient is CTAB.  COVID-19 and influenza PCR testing was collected and resulted negative.  Strep PCR testing was also negative.  Symptoms are consistent with likely viral upper respiratory infection and pharyngitis.  I discussed symptomatic management, including hydration, motrin, and tylenol. The patient felt safe being discharged from the ED.  They agreed to followup with the PCP if needed.  I provided ED return  precautions.    Final Clinical Impression(s) / ED Diagnoses Final diagnoses:  Viral URI    Rx / DC Orders ED Discharge Orders     None         Ernie Avena, MD 04/23/23 2135

## 2023-04-23 NOTE — Discharge Instructions (Addendum)
Your COVID-19 and influenza PCR testing was negative as was your strep PCR.  Recommend Tylenol and ibuprofen for pain control, continued oral fluid resuscitation at home.

## 2023-04-25 ENCOUNTER — Other Ambulatory Visit: Payer: Self-pay

## 2023-04-25 ENCOUNTER — Emergency Department (HOSPITAL_BASED_OUTPATIENT_CLINIC_OR_DEPARTMENT_OTHER)
Admission: EM | Admit: 2023-04-25 | Discharge: 2023-04-25 | Disposition: A | Discharge status: Home / Self Care | Payer: BC Managed Care – PPO | Attending: Emergency Medicine | Admitting: Emergency Medicine

## 2023-04-25 DIAGNOSIS — R059 Cough, unspecified: Secondary | ICD-10-CM | POA: Diagnosis not present

## 2023-04-25 DIAGNOSIS — B9789 Other viral agents as the cause of diseases classified elsewhere: Secondary | ICD-10-CM | POA: Diagnosis not present

## 2023-04-25 DIAGNOSIS — Z91040 Latex allergy status: Secondary | ICD-10-CM | POA: Diagnosis not present

## 2023-04-25 DIAGNOSIS — J069 Acute upper respiratory infection, unspecified: Secondary | ICD-10-CM | POA: Insufficient documentation

## 2023-04-25 DIAGNOSIS — Z1152 Encounter for screening for COVID-19: Secondary | ICD-10-CM | POA: Diagnosis not present

## 2023-04-25 LAB — SARS CORONAVIRUS 2 BY RT PCR: SARS Coronavirus 2 by RT PCR: NEGATIVE

## 2023-04-25 NOTE — ED Triage Notes (Signed)
Pt reports cough, nasal drainage, body aches, sore throat since Sat, pt reports she was seen for same with negative test, reports worsening s/s and concerned she tested too early

## 2023-04-25 NOTE — ED Provider Notes (Signed)
[place] EMERGENCY DEPARTMENT AT [facility]  Provider Note  CSN: [id number] Arrival date & time: 04/25/23 0548  History Chief Complaint  Patient presents with   possible covid    Jillian [name] is a 27 y.o. female here for re-evaluation of URI symptoms ongoing for about 3 days, seen in the ED on 7/27 and had neg Covid swab then. She reports symptoms have persisted although she has not taken any medications to help. She is concerned she may have been 'tested too early' for covid and is requesting retesting.    Home Medications Prior to Admission medications   Medication Sig Start Date End Date Taking? Authorizing Provider  acetaminophen (TYLENOL) 500 MG tablet Take 1 tablet (500 mg total) by mouth every 6 (six) hours. 09/28/22   Lorriane Shire, MD  ARIPiprazole (ABILIFY) 2 MG tablet Take 1 tablet (2 mg total) by mouth daily. 08/05/14   Adonis Brook, NP  atorvastatin (LIPITOR) 10 MG tablet Take 10 mg by mouth daily.    [provider]  Dulaglutide (TRULICITY [place]) Inject into the skin.    [provider]  ibuprofen (ADVIL) 600 MG tablet Take 1 tablet (600 mg total) by mouth every 6 (six) hours. 09/28/22   Lorriane Shire, MD  lisinopril (ZESTRIL) 5 MG tablet Take 5 mg by mouth daily.    [provider]  metFORMIN (GLUCOPHAGE) 1000 MG tablet Take 1,000 mg by mouth 2 (two) times daily with a meal.    [provider]  oxyCODONE (OXY IR/ROXICODONE) 5 MG immediate release tablet Take 1 tablet (5 mg total) by mouth every 4 (four) hours as needed for severe pain or breakthrough pain. Patient not taking: Reported on 11/09/2022 09/29/22   Lorriane Shire, MD  polyethylene glycol (MIRALAX) 17 g packet Take 17 g by mouth daily. 09/28/22   Lorriane Shire, MD  traZODone (DESYREL) 100 MG tablet Take 1 tablet (100 mg total) by mouth at bedtime as needed for sleep. Patient not taking: Reported on 09/14/2022 08/05/14   Adonis Brook, NP  venlafaxine  [facility]) 100 MG tablet Take 300 mg by mouth 2 (two) times daily.    [provider]     Allergies    Chicken allergy and Latex   Review of Systems   Review of Systems Please see HPI for pertinent positives and negatives  Physical Exam BP 130/82   Pulse (!) 112   Temp 99.1 F (37.3 C)   Resp 20   Ht 5\' 4"  (1.626 m)   Wt 97.5 kg   LMP 09/08/2022 (Approximate)   SpO2 98%   BMI 36.90 kg/m   Physical Exam Vitals and nursing note reviewed.  Constitutional:      Appearance: Normal appearance.  HENT:     Head: Normocephalic and atraumatic.     Nose: Nose normal.     Mouth/Throat:     Mouth: Mucous membranes are moist.     Pharynx: No oropharyngeal exudate or posterior oropharyngeal erythema.  Eyes:     Extraocular Movements: Extraocular movements intact.     Conjunctiva/sclera: Conjunctivae normal.  Cardiovascular:     Rate and Rhythm: Tachycardia present.  Pulmonary:     Effort: Pulmonary effort is normal.     Breath sounds: Normal breath sounds. No wheezing or rales.  Abdominal:     General: Abdomen is flat.     Palpations: Abdomen is soft.     Tenderness: There is no abdominal tenderness.  Musculoskeletal:  General: No swelling. Normal range of motion.     Cervical back: Neck supple.  Skin:    General: Skin is warm and dry.  Neurological:     General: No focal deficit present.     Mental Status: She is alert.  Psychiatric:        Mood and Affect: Mood normal.     ED Results / Procedures / Treatments   EKG None  Procedures Procedures  Medications Ordered in the ED Medications - No data to display  Initial Impression and Plan  Patient here with three days of URI symptoms requesting re-testing for Covid. Advised that she can check results in MyChart. Should isolate for 5-10 days depending on symptoms if positive. Otherwise recommend OTC symptomatic treatments, hydration and rest.   ED Course       MDM Rules/Calculators/A&P Medical  Decision Making Problems Addressed: Viral URI: acute illness or injury  Amount and/or Complexity of Data Reviewed Labs: ordered.  Risk OTC drugs.     Final Clinical Impression(s) / ED Diagnoses Final diagnoses:  Viral URI    Rx / DC Orders ED Discharge Orders     None        Pollyann Savoy, MD 04/25/23 [phone]

## 2023-04-26 DIAGNOSIS — J029 Acute pharyngitis, unspecified: Secondary | ICD-10-CM | POA: Diagnosis not present

## 2023-04-26 DIAGNOSIS — R0602 Shortness of breath: Secondary | ICD-10-CM | POA: Diagnosis not present

## 2023-04-26 DIAGNOSIS — R030 Elevated blood-pressure reading, without diagnosis of hypertension: Secondary | ICD-10-CM | POA: Diagnosis not present

## 2023-04-26 DIAGNOSIS — E7800 Pure hypercholesterolemia, unspecified: Secondary | ICD-10-CM | POA: Diagnosis not present

## 2023-04-26 DIAGNOSIS — R059 Cough, unspecified: Secondary | ICD-10-CM | POA: Diagnosis not present

## 2023-04-26 DIAGNOSIS — E119 Type 2 diabetes mellitus without complications: Secondary | ICD-10-CM | POA: Diagnosis not present

## 2023-04-26 DIAGNOSIS — Z6841 Body Mass Index (BMI) 40.0 and over, adult: Secondary | ICD-10-CM | POA: Diagnosis not present

## 2023-04-26 DIAGNOSIS — J018 Other acute sinusitis: Secondary | ICD-10-CM | POA: Diagnosis not present

## 2023-04-26 DIAGNOSIS — Z20822 Contact with and (suspected) exposure to covid-19: Secondary | ICD-10-CM | POA: Diagnosis not present

## 2023-04-26 DIAGNOSIS — E559 Vitamin D deficiency, unspecified: Secondary | ICD-10-CM | POA: Diagnosis not present

## 2023-04-28 DIAGNOSIS — F3132 Bipolar disorder, current episode depressed, moderate: Secondary | ICD-10-CM | POA: Diagnosis not present

## 2023-05-05 DIAGNOSIS — F3132 Bipolar disorder, current episode depressed, moderate: Secondary | ICD-10-CM | POA: Diagnosis not present

## 2023-05-07 DIAGNOSIS — Z6839 Body mass index (BMI) 39.0-39.9, adult: Secondary | ICD-10-CM | POA: Diagnosis not present

## 2023-05-07 DIAGNOSIS — A499 Bacterial infection, unspecified: Secondary | ICD-10-CM | POA: Diagnosis not present

## 2023-05-07 DIAGNOSIS — B3731 Acute candidiasis of vulva and vagina: Secondary | ICD-10-CM | POA: Diagnosis not present

## 2023-05-07 DIAGNOSIS — R300 Dysuria: Secondary | ICD-10-CM | POA: Diagnosis not present

## 2023-05-07 DIAGNOSIS — E119 Type 2 diabetes mellitus without complications: Secondary | ICD-10-CM | POA: Diagnosis not present

## 2023-05-07 DIAGNOSIS — E6609 Other obesity due to excess calories: Secondary | ICD-10-CM | POA: Diagnosis not present

## 2023-05-07 DIAGNOSIS — N390 Urinary tract infection, site not specified: Secondary | ICD-10-CM | POA: Diagnosis not present

## 2023-05-07 DIAGNOSIS — E7800 Pure hypercholesterolemia, unspecified: Secondary | ICD-10-CM | POA: Diagnosis not present

## 2023-05-12 DIAGNOSIS — F3132 Bipolar disorder, current episode depressed, moderate: Secondary | ICD-10-CM | POA: Diagnosis not present

## 2023-05-19 DIAGNOSIS — F3132 Bipolar disorder, current episode depressed, moderate: Secondary | ICD-10-CM | POA: Diagnosis not present

## 2023-05-26 DIAGNOSIS — F3132 Bipolar disorder, current episode depressed, moderate: Secondary | ICD-10-CM | POA: Diagnosis not present

## 2023-05-27 DIAGNOSIS — F251 Schizoaffective disorder, depressive type: Secondary | ICD-10-CM | POA: Diagnosis not present

## 2023-05-27 DIAGNOSIS — F429 Obsessive-compulsive disorder, unspecified: Secondary | ICD-10-CM | POA: Diagnosis not present

## 2023-06-02 DIAGNOSIS — F3132 Bipolar disorder, current episode depressed, moderate: Secondary | ICD-10-CM | POA: Diagnosis not present

## 2023-06-09 DIAGNOSIS — F3132 Bipolar disorder, current episode depressed, moderate: Secondary | ICD-10-CM | POA: Diagnosis not present

## 2023-06-16 DIAGNOSIS — F3132 Bipolar disorder, current episode depressed, moderate: Secondary | ICD-10-CM | POA: Diagnosis not present

## 2023-06-23 DIAGNOSIS — F3132 Bipolar disorder, current episode depressed, moderate: Secondary | ICD-10-CM | POA: Diagnosis not present

## 2023-06-30 DIAGNOSIS — F3132 Bipolar disorder, current episode depressed, moderate: Secondary | ICD-10-CM | POA: Diagnosis not present

## 2023-07-03 DIAGNOSIS — F419 Anxiety disorder, unspecified: Secondary | ICD-10-CM | POA: Diagnosis not present

## 2023-07-03 DIAGNOSIS — Z6839 Body mass index (BMI) 39.0-39.9, adult: Secondary | ICD-10-CM | POA: Diagnosis not present

## 2023-07-03 DIAGNOSIS — E7800 Pure hypercholesterolemia, unspecified: Secondary | ICD-10-CM | POA: Diagnosis not present

## 2023-07-03 DIAGNOSIS — E6609 Other obesity due to excess calories: Secondary | ICD-10-CM | POA: Diagnosis not present

## 2023-07-03 DIAGNOSIS — E119 Type 2 diabetes mellitus without complications: Secondary | ICD-10-CM | POA: Diagnosis not present

## 2023-07-03 DIAGNOSIS — F32A Depression, unspecified: Secondary | ICD-10-CM | POA: Diagnosis not present

## 2023-07-07 DIAGNOSIS — F3132 Bipolar disorder, current episode depressed, moderate: Secondary | ICD-10-CM | POA: Diagnosis not present

## 2023-07-14 DIAGNOSIS — F3132 Bipolar disorder, current episode depressed, moderate: Secondary | ICD-10-CM | POA: Diagnosis not present

## 2023-07-21 DIAGNOSIS — F3132 Bipolar disorder, current episode depressed, moderate: Secondary | ICD-10-CM | POA: Diagnosis not present

## 2023-07-27 DIAGNOSIS — Z6839 Body mass index (BMI) 39.0-39.9, adult: Secondary | ICD-10-CM | POA: Diagnosis not present

## 2023-07-27 DIAGNOSIS — F419 Anxiety disorder, unspecified: Secondary | ICD-10-CM | POA: Diagnosis not present

## 2023-07-27 DIAGNOSIS — F32A Depression, unspecified: Secondary | ICD-10-CM | POA: Diagnosis not present

## 2023-07-27 DIAGNOSIS — E6609 Other obesity due to excess calories: Secondary | ICD-10-CM | POA: Diagnosis not present

## 2023-07-27 DIAGNOSIS — E7800 Pure hypercholesterolemia, unspecified: Secondary | ICD-10-CM | POA: Diagnosis not present

## 2023-07-27 DIAGNOSIS — E559 Vitamin D deficiency, unspecified: Secondary | ICD-10-CM | POA: Diagnosis not present

## 2023-07-27 DIAGNOSIS — E119 Type 2 diabetes mellitus without complications: Secondary | ICD-10-CM | POA: Diagnosis not present

## 2023-07-27 DIAGNOSIS — R0683 Snoring: Secondary | ICD-10-CM | POA: Diagnosis not present

## 2023-07-27 DIAGNOSIS — R5383 Other fatigue: Secondary | ICD-10-CM | POA: Diagnosis not present

## 2023-07-28 DIAGNOSIS — F3132 Bipolar disorder, current episode depressed, moderate: Secondary | ICD-10-CM | POA: Diagnosis not present

## 2023-08-04 DIAGNOSIS — F411 Generalized anxiety disorder: Secondary | ICD-10-CM | POA: Diagnosis not present

## 2023-08-10 DIAGNOSIS — Z6841 Body Mass Index (BMI) 40.0 and over, adult: Secondary | ICD-10-CM | POA: Diagnosis not present

## 2023-08-10 DIAGNOSIS — F32A Depression, unspecified: Secondary | ICD-10-CM | POA: Diagnosis not present

## 2023-08-10 DIAGNOSIS — E119 Type 2 diabetes mellitus without complications: Secondary | ICD-10-CM | POA: Diagnosis not present

## 2023-08-10 DIAGNOSIS — F419 Anxiety disorder, unspecified: Secondary | ICD-10-CM | POA: Diagnosis not present

## 2023-08-10 DIAGNOSIS — G4710 Hypersomnia, unspecified: Secondary | ICD-10-CM | POA: Diagnosis not present

## 2023-08-11 DIAGNOSIS — F411 Generalized anxiety disorder: Secondary | ICD-10-CM | POA: Diagnosis not present

## 2023-08-15 DIAGNOSIS — F411 Generalized anxiety disorder: Secondary | ICD-10-CM | POA: Diagnosis not present

## 2023-08-18 DIAGNOSIS — F411 Generalized anxiety disorder: Secondary | ICD-10-CM | POA: Diagnosis not present

## 2023-08-24 DIAGNOSIS — F411 Generalized anxiety disorder: Secondary | ICD-10-CM | POA: Diagnosis not present

## 2023-08-29 DIAGNOSIS — F251 Schizoaffective disorder, depressive type: Secondary | ICD-10-CM | POA: Diagnosis not present

## 2023-08-29 DIAGNOSIS — G4726 Circadian rhythm sleep disorder, shift work type: Secondary | ICD-10-CM | POA: Diagnosis not present

## 2023-09-01 DIAGNOSIS — F411 Generalized anxiety disorder: Secondary | ICD-10-CM | POA: Diagnosis not present

## 2023-09-05 DIAGNOSIS — R051 Acute cough: Secondary | ICD-10-CM | POA: Diagnosis not present

## 2023-09-05 DIAGNOSIS — J0110 Acute frontal sinusitis, unspecified: Secondary | ICD-10-CM | POA: Diagnosis not present

## 2023-09-06 DIAGNOSIS — R059 Cough, unspecified: Secondary | ICD-10-CM | POA: Diagnosis not present

## 2023-09-06 DIAGNOSIS — Z6839 Body mass index (BMI) 39.0-39.9, adult: Secondary | ICD-10-CM | POA: Diagnosis not present

## 2023-09-06 DIAGNOSIS — E119 Type 2 diabetes mellitus without complications: Secondary | ICD-10-CM | POA: Diagnosis not present

## 2023-09-06 DIAGNOSIS — J018 Other acute sinusitis: Secondary | ICD-10-CM | POA: Diagnosis not present

## 2023-09-06 DIAGNOSIS — R6883 Chills (without fever): Secondary | ICD-10-CM | POA: Diagnosis not present

## 2023-09-06 DIAGNOSIS — E6609 Other obesity due to excess calories: Secondary | ICD-10-CM | POA: Diagnosis not present

## 2023-09-08 DIAGNOSIS — F411 Generalized anxiety disorder: Secondary | ICD-10-CM | POA: Diagnosis not present

## 2023-09-15 DIAGNOSIS — F411 Generalized anxiety disorder: Secondary | ICD-10-CM | POA: Diagnosis not present

## 2023-09-22 DIAGNOSIS — F411 Generalized anxiety disorder: Secondary | ICD-10-CM | POA: Diagnosis not present

## 2024-09-12 ENCOUNTER — Encounter (HOSPITAL_COMMUNITY): Payer: Self-pay | Admitting: Emergency Medicine

## 2024-09-12 ENCOUNTER — Ambulatory Visit (HOSPITAL_COMMUNITY)
Admission: EM | Admit: 2024-09-12 | Discharge: 2024-09-12 | Disposition: A | Discharge status: Home / Self Care | Source: Home / Self Care | Attending: Family Medicine | Admitting: Family Medicine

## 2024-09-12 ENCOUNTER — Other Ambulatory Visit: Payer: Self-pay

## 2024-09-12 DIAGNOSIS — L509 Urticaria, unspecified: Secondary | ICD-10-CM | POA: Diagnosis not present

## 2024-09-12 MED ORDER — PREDNISONE 10 MG (21) PO TBPK: ORAL_TABLET | Freq: Every day | ORAL | 0 refills | Status: AC

## 2024-09-12 NOTE — ED Triage Notes (Signed)
 Last week was prescribed ondansetron .  Patient has been taking more regularly.  Has been taking this medicine since 09/06/2024.  Patient noticed an itchy rash yesterday.  Rash to face, extremities and trunk.  A lot of swelling to tattoos, none are new.  No changes in soaps or detergents.

## 2024-09-13 NOTE — ED Provider Notes (Signed)
 [facility] CARE CENTER   [id number] 09/12/24 Arrival Time: 1906  ASSESSMENT & PLAN:  1. Urticaria    Unclear trigger. Will stop Zofran  as she questions relation. Meds ordered this encounter  Medications   predniSONE  (STERAPRED UNI-PAK 21 TAB) 10 MG (21) TBPK tablet    Sig: Take by mouth daily. Take as directed.    Dispense:  21 tablet    Refill:  0  Benadryl  if needed.    Follow-up Information     [facility] at [facility].   Specialty: Urgent Care Why: If worsening or failing to improve as anticipated. Contact information: [address] [place]  [address] [phone]                Reviewed expectations re: course of current medical issues. Questions answered. Outlined signs and symptoms indicating need for more acute intervention. Understanding verbalized. After Visit Summary given.   SUBJECTIVE: History from: Patient. Jillian [name] is a 28 y.o. female. Last week was prescribed ondansetron .  Patient has been taking more regularly.  Has been taking this medicine since 09/06/2024.  Patient noticed an itchy rash yesterday.  Rash to face, extremities and trunk.  A lot of swelling to tattoos, none are new.  No changes in soaps or detergents.   Denies: difficulty breathing. Normal PO intake without n/v/d.  OBJECTIVE:  Vitals:   09/12/24 1944  BP: 117/76  Pulse: 94  Resp: 18  Temp: 98.3 F (36.8 C)  TempSrc: Oral  SpO2: 99%    General appearance: alert; no distress Skin: warm and dry; smooth, slightly elevated and erythematous plaques of variable size over her face and trunk Psychological: alert and cooperative; normal mood and affect  Labs:  Labs Reviewed - No data to display  Imaging: No results found.  Allergies[1]  Past Medical History:  Diagnosis Date   Abnormal uterine bleeding    Anemia    around 2013   Anxiety    Patient follows with [facility], Dr. Olam Blackwater and sees a therapist once a week  for anxiety, depression, and OCD.   COVID-19 02/09/2021   COVID-19 08/21/2022   Patient states symptoms of sore throat, fatigue, nasal congestion, and cough all resolved by 09/03/2022.   Depression    Diabetes mellitus without complication (HCC)    Type 2, as of 09/15/22 patient is taking weekly Trulicity and daily metformin. Follows with PCP, Dr. Prentice Maffucci @ [facility].   Hypoglycemia    as a teenager   OCD (obsessive compulsive disorder)    Wears glasses    Social History   Socioeconomic History   Marital status: Single    Spouse name: Not on file   Number of children: 0   Years of education: Not on file   Highest education level: Some college, no degree  Occupational History    Employer: [employer]  Tobacco Use   Smoking status: Never    Passive exposure: Never   Smokeless tobacco: Never  Vaping Use   Vaping status: Never Used  Substance and Sexual Activity   Alcohol  use: Yes    Comment: rarely, about once a month   Drug use: No   Sexual activity: Never    Birth control/protection: Surgical  Other Topics Concern   Not on file  Social History Narrative   Patient is right-handed. She lives alone in a one level home. She drinks 2 cups of tea a day, and rarely soft drinks. She does not regularly  exercise. Some college - in school now.    Social Drivers of Health   Tobacco Use: Low Risk (09/12/2024)   Patient History    Smoking Tobacco Use: Never    Smokeless Tobacco Use: Never    Passive Exposure: Never  Recent Concern: Tobacco Use - Medium Risk (09/07/2024)   Received from [facility]   Patient History    Smoking Tobacco Use: Never    Smokeless Tobacco Use: Never    Passive Exposure: Current  Financial Resource Strain: Not on file  Food Insecurity: Not on file  Transportation Needs: Not on file  Physical Activity: Not on file  Stress: Not on file  Social Connections: Not on file  Intimate Partner Violence: Not on file  Depression (PHQ2-9): Low  Risk (11/09/2022)   Depression (PHQ2-9)    PHQ-2 Score: 4  Alcohol  Screen: Not on file  Housing: Not on file  Utilities: Not on file  Health Literacy: Not on file   Family History  Problem Relation Age of Onset   Diabetes Mother    Glaucoma Father    Cancer Other        unknown   Diabetes Paternal Grandmother    Hypertension Paternal Grandfather    Heart disease Maternal Grandfather    Healthy Sister        x 3 half   Eczema Sister    Past Surgical History:  Procedure Laterality Date   CYSTOSCOPY N/A 09/28/2022   Procedure: CYSTOSCOPY;  Surgeon: Jeralyn Crutch, MD;  Location: [facility];  Service: Gynecology;  Laterality: N/A;   DENTAL SURGERY     around 28 years old   REMOVAL OF DRUG DELIVERY IMPLANT Left 09/28/2022   Procedure: REMOVAL OF DRUG DELIVERY IMPLANT;  Surgeon: Jeralyn Crutch, MD;  Location: [facility];  Service: Gynecology;  Laterality: Left;   TOTAL LAPAROSCOPIC HYSTERECTOMY WITH SALPINGECTOMY Bilateral 09/28/2022   Procedure: TOTAL LAPAROSCOPIC HYSTERECTOMY WITH SALPINGECTOMY;  Surgeon: Jeralyn Crutch, MD;  Location:  SURGERY CENTER;  Service: Gynecology;  Laterality: Bilateral;   WISDOM TOOTH EXTRACTION Bilateral 2010      [1]  Allergies Allergen Reactions   Chicken Allergy Nausea And Vomiting   Latex Zina Rolinda Rogue, MD 09/13/24 [phone]
